# Patient Record
Sex: Female | Born: 1965 | Race: Black or African American | Hispanic: No | Marital: Single | Smoking: Former smoker
Health system: Southern US, Community
[De-identification: ages and names within clinical notes are randomized; demographics above are authoritative.]

## PROBLEM LIST (undated history)

## (undated) DIAGNOSIS — I10 Essential (primary) hypertension: Secondary | ICD-10-CM

## (undated) HISTORY — PX: TONSILLECTOMY: SUR1361

## (undated) HISTORY — PX: TUBAL LIGATION: SHX77

---

## 2010-06-10 ENCOUNTER — Encounter: Payer: Self-pay | Admitting: Unknown Physician Specialty

## 2015-03-11 ENCOUNTER — Encounter (HOSPITAL_BASED_OUTPATIENT_CLINIC_OR_DEPARTMENT_OTHER): Payer: Self-pay | Admitting: Emergency Medicine

## 2015-03-11 ENCOUNTER — Emergency Department (HOSPITAL_BASED_OUTPATIENT_CLINIC_OR_DEPARTMENT_OTHER): Payer: Medicaid Other

## 2015-03-11 ENCOUNTER — Emergency Department (HOSPITAL_BASED_OUTPATIENT_CLINIC_OR_DEPARTMENT_OTHER)
Admission: EM | Admit: 2015-03-11 | Discharge: 2015-03-11 | Disposition: A | Discharge status: Home / Self Care | Payer: Medicaid Other | Attending: Emergency Medicine | Admitting: Emergency Medicine

## 2015-03-11 DIAGNOSIS — R252 Cramp and spasm: Secondary | ICD-10-CM | POA: Diagnosis not present

## 2015-03-11 DIAGNOSIS — R52 Pain, unspecified: Secondary | ICD-10-CM

## 2015-03-11 DIAGNOSIS — M26622 Arthralgia of left temporomandibular joint: Secondary | ICD-10-CM | POA: Diagnosis not present

## 2015-03-11 DIAGNOSIS — R6884 Jaw pain: Secondary | ICD-10-CM | POA: Diagnosis present

## 2015-03-11 MED ORDER — HYDROMORPHONE HCL 1 MG/ML IJ SOLN
1.0000 mg | Freq: Once | INTRAMUSCULAR | Status: AC
  Administered 2015-03-11: 1 mg via INTRAMUSCULAR
  Filled 2015-03-11: qty 1

## 2015-03-11 MED ORDER — ORPHENADRINE CITRATE ER 100 MG PO TB12: 100.0000 mg | ORAL_TABLET | Freq: Two times a day (BID) | ORAL | Status: DC

## 2015-03-11 MED ORDER — HYDROCODONE-ACETAMINOPHEN 5-325 MG PO TABS: 1.0000 | ORAL_TABLET | ORAL | Status: DC | PRN

## 2015-03-11 MED ORDER — NAPROXEN 500 MG PO TABS: 500.0000 mg | ORAL_TABLET | Freq: Two times a day (BID) | ORAL | Status: DC

## 2015-03-11 MED ORDER — KETOROLAC TROMETHAMINE 60 MG/2ML IM SOLN
60.0000 mg | Freq: Once | INTRAMUSCULAR | Status: AC
  Administered 2015-03-11: 60 mg via INTRAMUSCULAR
  Filled 2015-03-11: qty 2

## 2015-03-11 NOTE — ED Provider Notes (Signed)
CSN: [id number]     Arrival date & time 03/11/15  1155 History   First MD Initiated Contact with Patient 03/11/15 1259     Chief Complaint  Patient presents with  . Jaw Pain     (Consider location/radiation/quality/duration/timing/severity/associated sxs/prior Treatment) HPI Patient reports that she gradually started developing pain in her left jaw that became very intense and is not allowing her to open and close her mouth. This happened over the past day. She indicates the pain is directly on her temporomandibular joint. She denies she's had earache or dental pain. She has not had sinus infection or nasal drainage. She denies neck pain or fever. She reports she has been taking an over-the-counter pain medication but it has not been relieving the symptoms. She denies that it started suddenly with any jaw motion, injury or yawn. History reviewed. No pertinent past medical history. History reviewed. No pertinent past surgical history. History reviewed. No pertinent family history. Social History  Substance Use Topics  . Smoking status: Never Smoker   . Smokeless tobacco: None  . Alcohol Use: No   OB History    No data available     Review of Systems  10 Systems reviewed and are negative for acute change except as noted in the HPI.   Allergies  Review of patient's allergies indicates no known allergies.  Home Medications   Prior to Admission medications   Medication Sig Start Date End Date Taking? Authorizing Provider  HYDROcodone-acetaminophen (NORCO/VICODIN) 5-325 MG tablet Take 1-2 tablets by mouth every 4 (four) hours as needed for moderate pain or severe pain. 03/11/15   Arby BarretteMarcy Crespin Forstrom, MD  naproxen (NAPROSYN) 500 MG tablet Take 1 tablet (500 mg total) by mouth 2 (two) times daily. 03/11/15   Arby BarretteMarcy Lam Mccubbins, MD  orphenadrine (NORFLEX) 100 MG tablet Take 1 tablet (100 mg total) by mouth 2 (two) times daily. 03/11/15   Arby BarretteMarcy Larina Lieurance, MD   BP 128/78 mmHg  Pulse 80   Temp(Src) 97.5 F (36.4 C) (Oral)  Resp 18  Ht 5\' 2"  (1.575 m)  Wt 142 lb (64.411 kg)  BMI 25.97 kg/m2  SpO2 100%  LMP 02/22/2015 Physical Exam  Constitutional: She is oriented to person, place, and time. She appears well-developed and well-nourished.  Patient is in pain secondary to her jaw. No respiratory distress. She is appropriate and a good historian.  HENT:  Head: Normocephalic and atraumatic.  Patient has focal pain to palpation over the left temporal mandibular joint. I cannot appreciate asymmetry or swelling. There is no tenderness over the angle of the mandible or the mandible itself. Also no tenderness over the zygoma. Facial swelling. Patient can only open the mouth approximately 1 inch. I have palpated the interior of her mouth and there is no tenderness along the gums. She has dentition that is in excellent condition. Left TM is normal. There is no tragus tenderness there are no rashes around the side of the face. The neck is supple without lymphadenopathy or meningismus.  Eyes: EOM are normal. Pupils are equal, round, and reactive to light.  Neck: Neck supple.  Cardiovascular: Normal rate and normal heart sounds.   Pulmonary/Chest: Effort normal and breath sounds normal.  Musculoskeletal: Normal range of motion. She exhibits no edema or tenderness.  Neurological: She is alert and oriented to person, place, and time. No cranial nerve deficit. Coordination normal.  Skin: Skin is warm and dry.  Psychiatric: She has a normal mood and affect.    ED Course  Procedures (including critical care time) Labs Review Labs Reviewed - No data to display  Imaging Review Dg Tmj Open & Close Unilateral Left  03/11/2015  CLINICAL DATA:  Fell pain, worse on the left. Difficulty opening mouth. EXAM: LEFT UNILATERAL TEMPOROMANDIBULAR JOINTS COMPARISON:  None available. FINDINGS: The mandible is intact. The TMJ appears closed on both views. There is no significant anterior translation from  the temporal fossa on either view. IMPRESSION: 1. No significant translation between open and closed views of the mouth. Minimal opening is evident. 2. The mandible appears to be intact. Electronically Signed   By: Marin Roberts M.D.   On: 03/11/2015 14:40   I have personally reviewed and evaluated these images and lab results as part of my medical decision-making.   EKG Interpretation None      MDM   Final diagnoses:  Pain  Arthralgia of left temporomandibular joint  Trismus   Patient is alert and appropriate. Only pain localized directly to the left temporal mandibular joint. Patient has trismus association with this. There is no injury pattern to suggest of the jaw has been dislocated or fractured. With administration of Toradol and Dilaudid patient did have improvement of mouth opening by approximately three quarters of an inch. At this time I suspect this is most consistent with focal TMJ inflammation with local muscular spasm. There is no evidence of other illness or facial etiology. Patient has dental follow-up on Wednesday. She will be trialed on naproxen, Norflex and Vicodin for pain control. She is counseled to return should she develop objective swelling, worsening pain, fever or other concerning symptoms.    Arby Barrette, MD 03/11/15 [phone]

## 2015-03-11 NOTE — Discharge Instructions (Signed)

## 2015-03-11 NOTE — ED Notes (Signed)
Pt reports acute onset of jaw pain, L>R, denies injury or illness.

## 2015-07-04 ENCOUNTER — Encounter (HOSPITAL_BASED_OUTPATIENT_CLINIC_OR_DEPARTMENT_OTHER): Payer: Self-pay | Admitting: *Deleted

## 2015-07-04 ENCOUNTER — Emergency Department (HOSPITAL_BASED_OUTPATIENT_CLINIC_OR_DEPARTMENT_OTHER)
Admission: EM | Admit: 2015-07-04 | Discharge: 2015-07-04 | Disposition: A | Discharge status: Home / Self Care | Payer: Medicaid Other | Attending: Emergency Medicine | Admitting: Emergency Medicine

## 2015-07-04 DIAGNOSIS — R110 Nausea: Secondary | ICD-10-CM | POA: Diagnosis not present

## 2015-07-04 DIAGNOSIS — J029 Acute pharyngitis, unspecified: Secondary | ICD-10-CM | POA: Diagnosis not present

## 2015-07-04 LAB — RAPID STREP SCREEN (MED CTR MEBANE ONLY): STREPTOCOCCUS, GROUP A SCREEN (DIRECT): NEGATIVE

## 2015-07-04 MED ORDER — ONDANSETRON 4 MG PO TBDP: 4.0000 mg | ORAL_TABLET | Freq: Three times a day (TID) | ORAL | Status: DC | PRN

## 2015-07-04 MED ORDER — AMOXICILLIN-POT CLAVULANATE 875-125 MG PO TABS
1.0000 | ORAL_TABLET | Freq: Once | ORAL | Status: AC
  Administered 2015-07-04: 1 via ORAL
  Filled 2015-07-04: qty 1

## 2015-07-04 MED ORDER — DEXAMETHASONE SODIUM PHOSPHATE 10 MG/ML IJ SOLN
10.0000 mg | Freq: Once | INTRAMUSCULAR | Status: AC
  Administered 2015-07-04: 10 mg via INTRAMUSCULAR
  Filled 2015-07-04: qty 1

## 2015-07-04 MED ORDER — IBUPROFEN 800 MG PO TABS: 800.0000 mg | ORAL_TABLET | Freq: Three times a day (TID) | ORAL | Status: DC

## 2015-07-04 MED ORDER — KETOROLAC TROMETHAMINE 60 MG/2ML IM SOLN
60.0000 mg | Freq: Once | INTRAMUSCULAR | Status: AC
  Administered 2015-07-04: 60 mg via INTRAMUSCULAR
  Filled 2015-07-04: qty 2

## 2015-07-04 MED ORDER — AMOXICILLIN-POT CLAVULANATE 875-125 MG PO TABS: 1.0000 | ORAL_TABLET | Freq: Two times a day (BID) | ORAL | Status: DC

## 2015-07-04 MED FILL — IBUPROFEN 800 MG TABLET: 800 | 7 days supply | Qty: 21 | Fill #0

## 2015-07-04 MED FILL — ONDANSETRON ODT 4 MG TABLET: 4 | 6 days supply | Qty: 20 | Fill #0

## 2015-07-04 MED FILL — AMOX-CLAV 875-125 MG TABLET: 875-125 | 7 days supply | Qty: 14 | Fill #0

## 2015-07-04 NOTE — ED Notes (Signed)
C/o Sorethroat x 2 weeks with pain on chin and neck area as well. No fever.

## 2015-07-04 NOTE — Discharge Instructions (Signed)
You have been seen today for a sore throat. Follow up with PCP as needed. Return to ED should symptoms worsen. Please take all of your antibiotics until finished!   You may develop abdominal discomfort or diarrhea from the antibiotic.  You may help offset this with probiotics which you can buy or get in yogurt. Do not eat or take the probiotics until 2 hours after your antibiotic. Get plenty of rest and drink plenty of fluids. Ibuprofen for pain or fever. Warm liquids or Chloraseptic spray may help soothe the sore throat.   Emergency Department Resource Guide 1) Find a Doctor and Pay Out of Pocket Although you won't have to find out who is covered by your insurance plan, it is a good idea to ask around and get recommendations. You will then need to call the office and see if the doctor you have chosen will accept you as a new patient and what types of options they offer for patients who are self-pay. Some doctors offer discounts or will set up payment plans for their patients who do not have insurance, but you will need to ask so you aren't surprised when you get to your appointment.  2) Contact Your Local Health Department Not all health departments have doctors that can see patients for sick visits, but many do, so it is worth a call to see if yours does. If you don't know where your local health department is, you can check in your phone book. The CDC also has a tool to help you locate your state's health department, and many state websites also have listings of all of their local health departments.  3) Find a Walk-in Clinic If your illness is not likely to be very severe or complicated, you may want to try a walk in clinic. These are popping up all over the country in pharmacies, drugstores, and shopping centers. They're usually staffed by nurse practitioners or physician assistants that have been trained to treat common illnesses and complaints. They're usually fairly quick and inexpensive. However,  if you have serious medical issues or chronic medical problems, these are probably not your best option.  No Primary Care Doctor: - Call Health Connect at  [phone] - they can help you locate a primary care doctor that  accepts your insurance, provides certain services, etc. - Physician Referral Service- [phone]  Chronic Pain Problems: Organization         Address  Phone   Notes  [facility]  [phone] Patients need to be referred by their primary care doctor.   Medication Assistance: Organization         Address  Phone   Notes  [facility] Medication [facility] [address] [place] [place], [place] [address] [phone] --Must be a resident of [facility] -- Must have NO insurance coverage whatsoever (no Medicaid/ Medicare, etc.) -- The pt. MUST have a primary care doctor that directs their care regularly and follows them in the community   [facility]ist  [phone]   [employer]  [phone]    Agencies that provide inexpensive medical care: Organization         Address  Phone   Notes  [facility]  [phone]   [facility]    [phone]   [facility] [address] [place], [place] [address] [phone]   [facility] [address] [place], [place] [phone]  Planned Parenthood    [phone]   [facility]    [phone]   [facility] and [place] [address], [place] Phone:  [phone], Fax:  [phone] Hours of Operation:  9 am - 6 pm, M-F.  Also accepts Medicaid/Medicare and self-pay.  [facility] for [address], [address], [place] Phone: [phone], Fax: [phone]. Hours of Operation:  8:30 am - 5:30 pm, M-F.  Also accepts Medicaid and self-pay.  [facility] [place] [address] [place] Phone: [phone]   [place], [place], [place] [phone], Ext. [phone] Mondays & Thursdays: 7-9 AM.  First 15 patients are seen on a first come, first serve basis.    [place] Providers:  Organization         Address  Phone   Notes  [facility] [address], [place] [phone] Also accepts self-pay patients.  [facility] [address], [place]  [phone]   [place], [address], [place] [phone]   [facility] [address], [place] [phone]   Lucianne Lei [address], [place]   [phone] Only accepts [place] Access [place] patients after they have their name applied to their card.   Self-Pay (no insurance) in [facility]:  Organization         Address  Phone   Notes  Sickle Cell Patients, [facility] [place] [phone]   [facility] [place] [phone]   [facility] [place]  [place], [place], [place] [phone]   [facility]/Dr. Osei-Bonsu  [address], [place] or [address], [place] [phone] Phone number for both [place] and [place] locations is the same.  [facility] [address], [place] [phone]   [facility] [address] [place] or [address] [phone]   [facility] [address] [place] [phone], phone; [phone], fax Sees patients 1st and 3rd Saturday of every month.  Must not qualify for public or private insurance (i.e. Medicaid, Medicare, [place] Health Choice, Veterans' Benefits)  Household income should be no more than 200% of the poverty level The clinic cannot treat you if you are pregnant or think you are  pregnant  Sexually transmitted diseases are not treated at the clinic.    Dental Care: Organization         Address  Phone  Notes  [facility] Department of [facility] [place] [phone] Accepts children up to age 33 who are enrolled in [place] or [place]; pregnant women with a Medicaid card; and children who have applied for Medicaid or [place] Health Choice, but were declined, whose parents can pay a reduced fee at time of service.  [facility] of [facility]  [address], [place] [phone] Accepts children up to age 110 who are enrolled in [place] or [place]; pregnant women with a Medicaid card; and children who have applied for Medicaid or [place], but were declined,  whose parents can pay a reduced fee at time of service.  [place] Adult [facility] Access PROGRAM  [place] [phone] Patients are seen by appointment only. Walk-ins are not accepted. [place] will see patients 59 years of age and older. Monday - Tuesday (8am-5pm) Most Wednesdays (8:30-5pm) $30 per visit, cash only  [facility] Adult [facility] Access PROGRAM  [address], [facility] [phone] Patients are seen by appointment only. Walk-ins are not accepted. [place] will see patients 72 years of age and older. One Wednesday Evening (Monthly: Volunteer Based).  $30 per visit, cash only  [place]  [phone] for adults; Children under age 53, call Graduate Pediatric Dentistry at [phone]. Children aged 3-14, please call [phone] to request a pediatric application.  Dental services are provided in all areas of dental care including fillings, crowns and bridges, complete and partial dentures, implants, gum treatment, root canals, and extractions. Preventive care is also provided. Treatment is provided to both adults and  children. Patients are selected via a lottery and there is often a waiting list.   [facility] [address], [place]  [phone] [email or web address]   [facility] [address] [place], [place] [phone], Ext. [phone] Second and Fourth Thursday of each month, opens at 6:30 AM; Clinic ends at 9 AM.  Patients are seen on a first-come first-served basis, and a limited number are seen during each clinic.   [facility]  [address] Hillard Danker [place], [place] [phone]   Eligibility Requirements You must have lived in [place], [place], or [place] counties for at least the last three months.   You cannot be eligible for state or federal sponsored [employer], including [employer], [place], or [employer].   You generally cannot be eligible for healthcare insurance through your employer.    How to apply: Eligibility screenings are held every Tuesday and Wednesday afternoon from 1:00 pm until 4:00 pm. You do not need an appointment for the interview!  [facility] [address], [place], [place]   [place]  [place] Department  [place]  [phone]    Behavioral Health Resources in the Community: Intensive Outpatient Programs Organization         Address  Phone  Notes  [place]. [address], [place], [place] [phone]   [facility] Outpatient [address] [place], [place]   ADS: Alcohol [facility] Drug S[facility] [address] [place], [place]   [place] [address]  [place], [place] or [phone]   Substance Abuse Resources Organization         Address  Phone  Notes  Alcohol and Drug Services  [phone]   [place]  [phone]   [facility]    Chinita Pester  [phone]   Residential & Outpatient Substance Abuse Program  [phone]   Psychological Services Organization         Address  Phone  Notes  [facility] [place]  [place]  [phone]   [place] [address] [place] or [phone]    Mobile Crisis Teams Organization         Address  Phone  Notes  [facility] Alternatives, Mobile  Crisis Care Unit  [phone]   [facility]ive Psychotherapeutic Services  [address] [place], [place]   [facility] [address], [place] [phone]    Self-Help/Support Groups Organization         Address  Phone             Notes  [place]. of [place] - variety of support groups  [place] Call for more information  Narcotics Anonymous (NA), [facility] [address], [employer] [place]  2 meetings at this location   Special educational needs teacher         Address  Phone  Notes  [facility]AP Residential Treatment [place],    [place]  [phone]   [facility]  [address], [place] [phone], [place], [place]   [place], [place] [phone] Admissions: 8am-3pm M-F  Incentives Substance [place] [address]    [place], [place] [phone]   [facility] [address] [place], [place], [place]   [facility] [address],  [place], [place]   [facility] - Intensive Outpatient [address], [place], [place]   [facility] ([place].,  [place], [place] [phone] or [phone]   [facility] ([facility]) [address], [place], [place] Accepts Medicaid  [facility] [place] [address]  [place] [phone] Substance Abuse/Addiction Treatment   [facility] Organization         Address  Phone  Notes  [facility]  [phone]   Domenic Schwab, PhD [address] Arlis Porta [place], [place]   [phone] or [phone]   [place], [place] [phone]   [facility] [address] [place], [place] [phone] Insurance/Medicaid/sponsorship through [facility] and Families [address], [address] [place]                                    [place], [place] [phone] [place] [address], [place] [phone]    Dr. Adele Schilder  [phone]   [facility] Dept. 1) [address], [place] 2) [place] 3)  [place] [address], [place] [phone]  [phone]   [place] [phone] or [phone] (After Hours)

## 2015-07-04 NOTE — ED Provider Notes (Signed)
CSN: [id number]     Arrival date & time 07/04/15  8657 History   First MD Initiated Contact with Patient 07/04/15 1004     No chief complaint on file.    (Consider location/radiation/quality/duration/timing/severity/associated sxs/prior Treatment) HPI   Dajanae Brophy is a 50 y.o. female, patient with no pertinent past medical history, presenting to the ED with sore throat for the last two weeks. Pt states the pain is localized to the right side of her throat. Pt rates the pain at 10/10, throbbing, non-radiating. Pt also complains of some nausea. Pt has tried tylenol for pain with minimal relief. Pt denies fever/chills, vomiting, difficulty swallowing or breathing, or any other complaints.   History reviewed. No pertinent past medical history. History reviewed. No pertinent past surgical history. No family history on file. Social History  Substance Use Topics  . Smoking status: Never Smoker   . Smokeless tobacco: None  . Alcohol Use: No   OB History    No data available     Review of Systems  Constitutional: Negative for fever and chills.  HENT: Positive for sore throat. Negative for trouble swallowing and voice change.   Gastrointestinal: Positive for nausea. Negative for vomiting.  Neurological: Negative for headaches.      Allergies  Review of patient's allergies indicates no known allergies.  Home Medications   Prior to Admission medications   Medication Sig Start Date End Date Taking? Authorizing Provider  amoxicillin-clavulanate (AUGMENTIN) 875-125 MG tablet Take 1 tablet by mouth every 12 (twelve) hours. 07/04/15   Shawn C Joy, PA-C  ibuprofen (ADVIL,MOTRIN) 800 MG tablet Take 1 tablet (800 mg total) by mouth 3 (three) times daily. 07/04/15   Shawn C Joy, PA-C  ondansetron (ZOFRAN ODT) 4 MG disintegrating tablet Take 1 tablet (4 mg total) by mouth every 8 (eight) hours as needed for nausea or vomiting. 07/04/15   Shawn C Joy, PA-C   BP 125/98 mmHg  Pulse 85  Temp(Src)  97.9 F (36.6 C) (Oral)  Resp 16  Ht  (1.6 m)  Wt 60.328 kg  BMI 23.57 kg/m2  SpO2 99%  LMP 06/20/2015 Physical Exam  Constitutional: She appears well-developed and well-nourished. No distress.  HENT:  Head: Normocephalic and atraumatic.  Right Ear: External ear and ear canal normal.  Left Ear: Tympanic membrane, external ear and ear canal normal.  Mouth/Throat: Uvula is midline and mucous membranes are normal. No oral lesions. No trismus in the jaw. No uvula swelling. Posterior oropharyngeal edema and posterior oropharyngeal erythema present. No tonsillar abscesses.  Eyes: Conjunctivae are normal.  Neck: Normal range of motion. Neck supple.  No neck edema or masses.  Cardiovascular: Normal rate and regular rhythm.   Pulmonary/Chest: Effort normal.  Lymphadenopathy:    She has no cervical adenopathy.  Neurological: She is alert.  Skin: Skin is warm and dry. She is not diaphoretic.  Nursing note and vitals reviewed.   ED Course  Procedures (including critical care time) Labs Review Labs Reviewed  RAPID STREP SCREEN (NOT AT [facility])  CULTURE, GROUP A STREP [facility])    Imaging Review No results found. I have personally reviewed and evaluated these lab results as part of my medical decision-making.   EKG Interpretation None      MDM   Final diagnoses:  Pharyngitis    Kendyll Powell presents with a sore throat for the last 2 weeks.  Patient is afebrile, not tachycardic, not tachypneic, maintains SPO2 of 99% on room air, is nontoxic appearing, and  is in no apparent distress. Antibiotic prescribed due to the duration of symptoms and the unilateral nature of the pain. No masses or visual signs of peritonsillar or retro-pharyngeal abscess were noted. Toradol given for pain. Decadron given for sensation of swelling. No indication for imaging at this time. Patient was given home care instructions and strict return precautions. Patient voiced understanding of these instructions,  accepted the plan, and is comfortable with discharge.  Anselm Pancoast, PA-C 07/04/15 1050  Derwood Kaplan, MD 07/04/15 1622

## 2015-07-06 LAB — CULTURE, GROUP A STREP (THRC)

## 2016-06-11 ENCOUNTER — Encounter (HOSPITAL_BASED_OUTPATIENT_CLINIC_OR_DEPARTMENT_OTHER): Payer: Self-pay

## 2016-06-11 ENCOUNTER — Emergency Department (HOSPITAL_BASED_OUTPATIENT_CLINIC_OR_DEPARTMENT_OTHER)
Admission: EM | Admit: 2016-06-11 | Discharge: 2016-06-11 | Disposition: A | Discharge status: Home / Self Care | Payer: Medicaid Other | Attending: Dermatology | Admitting: Dermatology

## 2016-06-11 DIAGNOSIS — R05 Cough: Secondary | ICD-10-CM | POA: Insufficient documentation

## 2016-06-11 DIAGNOSIS — Z5321 Procedure and treatment not carried out due to patient leaving prior to being seen by health care provider: Secondary | ICD-10-CM | POA: Diagnosis not present

## 2016-06-11 NOTE — ED Notes (Signed)
Pt seen leaving ed by registration staff. Did not notify staff that she was leaving.

## 2016-06-11 NOTE — ED Triage Notes (Signed)
C/o cough, chills,SOB x 5 days-NAD-steady gait

## 2016-06-26 ENCOUNTER — Emergency Department (HOSPITAL_BASED_OUTPATIENT_CLINIC_OR_DEPARTMENT_OTHER)
Admission: EM | Admit: 2016-06-26 | Discharge: 2016-06-26 | Disposition: A | Discharge status: Home / Self Care | Payer: Medicaid Other | Source: Home / Self Care

## 2016-06-26 ENCOUNTER — Encounter (HOSPITAL_BASED_OUTPATIENT_CLINIC_OR_DEPARTMENT_OTHER): Payer: Self-pay | Admitting: *Deleted

## 2016-06-26 ENCOUNTER — Emergency Department (HOSPITAL_BASED_OUTPATIENT_CLINIC_OR_DEPARTMENT_OTHER)
Admission: EM | Admit: 2016-06-26 | Discharge: 2016-06-26 | Disposition: A | Discharge status: Home / Self Care | Payer: Medicaid Other | Attending: Emergency Medicine | Admitting: Emergency Medicine

## 2016-06-26 DIAGNOSIS — M542 Cervicalgia: Secondary | ICD-10-CM | POA: Insufficient documentation

## 2016-06-26 DIAGNOSIS — Z5321 Procedure and treatment not carried out due to patient leaving prior to being seen by health care provider: Secondary | ICD-10-CM

## 2016-06-26 DIAGNOSIS — R110 Nausea: Secondary | ICD-10-CM | POA: Insufficient documentation

## 2016-06-26 DIAGNOSIS — R51 Headache: Secondary | ICD-10-CM | POA: Diagnosis not present

## 2016-06-26 DIAGNOSIS — R599 Enlarged lymph nodes, unspecified: Secondary | ICD-10-CM | POA: Diagnosis not present

## 2016-06-26 DIAGNOSIS — J029 Acute pharyngitis, unspecified: Secondary | ICD-10-CM | POA: Insufficient documentation

## 2016-06-26 LAB — RAPID STREP SCREEN (MED CTR MEBANE ONLY): Streptococcus, Group A Screen (Direct): NEGATIVE

## 2016-06-26 MED ORDER — IBUPROFEN 400 MG PO TABS
400.0000 mg | ORAL_TABLET | Freq: Once | ORAL | Status: AC | PRN
  Administered 2016-06-26: 400 mg via ORAL
  Filled 2016-06-26 (×2): qty 1

## 2016-06-26 MED ORDER — CLINDAMYCIN HCL 150 MG PO CAPS: 300.0000 mg | ORAL_CAPSULE | Freq: Four times a day (QID) | ORAL | 0 refills | Status: DC

## 2016-06-26 NOTE — ED Provider Notes (Signed)
[facility] Provider Note   CSN: [id number] Arrival date & time: 06/26/16  1448  By signing my name below, I, Rosario Adie, attest that this documentation has been prepared under the direction and in the presence of Renne Crigler, PA-C.  Electronically Signed: Rosario Adie, ED Scribe. 06/26/16. 6:00 PM.  History   Chief Complaint Chief Complaint  Patient presents with  . Sore Throat   The history is provided by the patient. No language interpreter was used.    HPI Comments: Kimberly [name] is a 51 y.o. female with no pertinent PMHx, who presents to the Emergency Department complaining of constant bilateral anterior neck pain (L>R) beginning one week ago. She notes associated intermittent headaches and nausea secondary to the onset of her neck pain. Pt additionally notes that she has also experienced episodes of dysphasia at night after periods of laying flat, but this will resolved shortly after sitting forwards. She was seen in the ED for this issue this morning, but she eloped at that time. Pt was screened for strep throat which was resulted as negative. Pt reports that the her neck pain on the left side is exacerbated with palpation over the area. No noted treatments for her pain were tried prior to coming into the ED. She is not currently followed by a PCP. Pt denies fever, vomiting, or any other associated symptoms.   History reviewed. No pertinent past medical history.  There are no active problems to display for this patient.  Past Surgical History:  Procedure Laterality Date  . TONSILLECTOMY     OB History    No data available     Home Medications    Prior to Admission medications   Not on File   Family History No family history on file.  Social History Social History  Substance Use Topics  . Smoking status: Never Smoker  . Smokeless tobacco: Never Used  . Alcohol use No   Allergies   Patient has no known allergies.  Review of  Systems Review of Systems  Constitutional: Negative for chills, fatigue and fever.  HENT: Positive for trouble swallowing (pain only). Negative for congestion, ear pain, rhinorrhea, sinus pressure and sore throat.   Eyes: Negative for redness.  Respiratory: Negative for cough and wheezing.   Gastrointestinal: Positive for nausea. Negative for abdominal pain, diarrhea and vomiting.  Genitourinary: Negative for dysuria.  Musculoskeletal: Positive for neck pain (anterior, bilateral). Negative for myalgias and neck stiffness.  Skin: Negative for rash.  Neurological: Positive for headaches.  Hematological: Negative for adenopathy.   Physical Exam Updated Vital Signs BP 137/96 (BP Location: Right Arm)   Pulse 94   Temp 98.2 F (36.8 C) (Oral)   Resp 16   Ht 5\' 2"  (1.575 m)   Wt 140 lb (63.5 kg)   LMP 06/04/2016   SpO2 99%   BMI 25.61 kg/m   Physical Exam  Constitutional: She appears well-developed and well-nourished. No distress.  HENT:  Head: Normocephalic and atraumatic.  Right Ear: Tympanic membrane, external ear and ear canal normal.  Left Ear: Tympanic membrane, external ear and ear canal normal.  Nose: Nose normal. No mucosal edema or rhinorrhea.  Mouth/Throat: Oropharynx is clear and moist and mucous membranes are normal. No oral lesions. No trismus in the jaw. No uvula swelling. No oropharyngeal exudate, posterior oropharyngeal edema, posterior oropharyngeal erythema or tonsillar abscesses. No tonsillar exudate.  No trismus. Full range of motion of neck without difficulty.   Eyes: Conjunctivae are normal.  Neck: Normal range of motion.  Cardiovascular: Normal rate.   Pulmonary/Chest: Effort normal.  Abdominal: She exhibits no distension.  Musculoskeletal: Normal range of motion.  Lymphadenopathy:       Head (right side): No submental, no submandibular, no tonsillar, no preauricular, no posterior auricular and no occipital adenopathy present.       Head (left side):  Submandibular (Tender) adenopathy present. No submental, no tonsillar, no preauricular, no posterior auricular and no occipital adenopathy present.    She has no cervical adenopathy.  Neurological: She is alert.  Skin: No pallor.  Psychiatric: She has a normal mood and affect. Her behavior is normal.  Nursing note and vitals reviewed.  ED Treatments / Results  DIAGNOSTIC STUDIES: Oxygen Saturation is 99% on RA, normal by my interpretation.   COORDINATION OF CARE: 6:00 PM-Discussed next steps with pt including consult with attending provider, Dr Rush Landmarkegeler. He has seen patient. Agrees with trial of clindamycin, f/u with ENT if not improving after that. Pt verbalized understanding and is agreeable with the plan.    Procedures Procedures   Medications Ordered in ED Medications - No data to display  Initial Impression / Assessment and Plan / ED Course  I have reviewed the triage vital signs and the nursing notes.  Pertinent labs & imaging results that were available during my care of the patient were reviewed by me and considered in my medical decision making (see chart for details).     Vital signs reviewed and are as follows: Vitals:   06/26/16 1451  BP: 137/96  Pulse: 94  Resp: 16  Temp: 98.2 F (36.8 C)     Final Clinical Impressions(s) / ED Diagnoses   Final diagnoses:  Swollen lymph nodes   Patient with Tender nodule likely submandibular lymph node on the left. No trismus or difficulty with range of motion of neck. Do not suspect peritonsillar abscess, retropharyngeal abscess or other deep space infection. This is likely a reactive lymph node. Less likely swollen salivary glands. No respiratory difficulty. Patient is swallowing well. No indication for advanced imaging at that time. Patient given ENT f/u.   New Prescriptions New Prescriptions   CLINDAMYCIN (CLEOCIN) 150 MG CAPSULE    Take 2 capsules (300 mg total) by mouth every 6 (six) hours.   I personally  performed the services described in this documentation, which was scribed in my presence. The recorded information has been reviewed and is accurate.     Renne CriglerJoshua Elbert Polyakov, PA-C 06/26/16 1834    Canary Brimhristopher J Tegeler, MD 06/27/16 [phone]

## 2016-06-26 NOTE — Discharge Instructions (Signed)
Please read and follow all provided instructions.  Your diagnoses today include:  1. Swollen lymph nodes     Tests performed today include:  Vital signs. See below for your results today.   Strep test - negative   Medications prescribed:   Clindamycin - antibiotic  You have been prescribed an antibiotic medicine: take the entire course of medicine even if you are feeling better. Stopping early can cause the antibiotic not to work.  Home care instructions:  Follow any educational materials contained in this packet.  Follow-up instructions: Follow-up with ear nose and throat doctor listed in 2 weeks if not improved.  Return instructions:   Please return to the Emergency Department if you experience worsening symptoms.   Return with worsening pain in her neck, difficulty swallowing, fevers, difficult to breathing.   Please return if you have any other emergent concerns.  Additional Information:  Your vital signs today were: BP 137/96 (BP Location: Right Arm)    Pulse 94    Temp 98.2 F (36.8 C) (Oral)    Resp 16    Ht 5\' 2"  (1.575 m)    Wt 63.5 kg    LMP 06/04/2016    SpO2 99%    BMI 25.61 kg/m  If your blood pressure (BP) was elevated above 135/85 this visit, please have this repeated by your doctor within one month. ---------------

## 2016-06-26 NOTE — ED Triage Notes (Signed)
States she came here 2 weeks ago for cold symptoms but left before being seen due to long wait. Here today for sore throat. No fever. She feels sinus drainage, headache.

## 2016-06-26 NOTE — ED Triage Notes (Signed)
She was here this am and left. Returns with sore throat no distress.

## 2016-06-28 LAB — CULTURE, GROUP A STREP (THRC)

## 2017-02-02 ENCOUNTER — Encounter (HOSPITAL_BASED_OUTPATIENT_CLINIC_OR_DEPARTMENT_OTHER): Payer: Self-pay

## 2017-02-02 ENCOUNTER — Emergency Department (HOSPITAL_BASED_OUTPATIENT_CLINIC_OR_DEPARTMENT_OTHER)
Admission: EM | Admit: 2017-02-02 | Discharge: 2017-02-02 | Disposition: A | Discharge status: Home / Self Care | Payer: Medicaid Other | Attending: Emergency Medicine | Admitting: Emergency Medicine

## 2017-02-02 DIAGNOSIS — Z87891 Personal history of nicotine dependence: Secondary | ICD-10-CM | POA: Insufficient documentation

## 2017-02-02 DIAGNOSIS — R590 Localized enlarged lymph nodes: Secondary | ICD-10-CM | POA: Diagnosis not present

## 2017-02-02 DIAGNOSIS — J029 Acute pharyngitis, unspecified: Secondary | ICD-10-CM | POA: Diagnosis present

## 2017-02-02 NOTE — ED Provider Notes (Signed)
[facility]   CSN: [id number] Arrival date & time: 02/02/17  1537     History   Chief Complaint Chief Complaint  Patient presents with  . Sore Throat    HPI Kimberly [name] is a 51 y.o. female.  HPI   Ms. Melick is a 51yo female with no significant past medical history who presents to the Emergency Department for evaluation of tenderness in her left neck. Patient states that she has had tenderness over her left neck for several weeks now. Pain is an aching 8/10 and is worsened with palpation over the neck. She is able to move neck in all directions without difficulty, no voice change, no difficulty swallowing, no sore throat or fever. Patient states that she has had pain over the lymph node a few months ago and was referred to ENT who did an ultrasound which was negative. The pain went away for a period of time but is now back. She states that this pain is similar to her previous episode and in the same place. She denies previous cancer history. She has tried ibuprofen for the pain with some relief. She did have a cough several weeks ago when the pain in her neck started.   History reviewed. No pertinent past medical history.  There are no active problems to display for this patient.   Past Surgical History:  Procedure Laterality Date  . TONSILLECTOMY      OB History    No data available       Home Medications    Prior to Admission medications   Not on File    Family History No family history on file.  Social History Social History  Substance Use Topics  . Smoking status: Former Games developer  . Smokeless tobacco: Never Used  . Alcohol use Yes     Comment: occ     Allergies   Patient has no known allergies.   Review of Systems Review of Systems  Constitutional: Negative for chills, fatigue, fever and unexpected weight change.  HENT: Negative for congestion, dental problem, ear pain, mouth sores, rhinorrhea, sore throat, trouble swallowing  and voice change.   Eyes: Negative for pain and visual disturbance.  Respiratory: Positive for cough (weeks ago).   Musculoskeletal: Positive for neck pain. Negative for neck stiffness.  Skin: Negative for rash and wound.     Physical Exam Updated Vital Signs BP (!) 141/88 (BP Location: Left Arm)   Pulse (!) 101   Temp 99.5 F (37.5 C) (Oral)   Resp 18   Ht  (1.6 m)   Wt 64.9 kg (143 lb 1.3 oz)   LMP 01/23/2017   SpO2 100%   BMI 25.35 kg/m   Physical Exam  Constitutional: She is oriented to person, place, and time. She appears well-developed and well-nourished. No distress.  HENT:  Head: Normocephalic and atraumatic.  Right Ear: Tympanic membrane and external ear normal.  Left Ear: Tympanic membrane and external ear normal.  Nose: No rhinorrhea. Right sinus exhibits no maxillary sinus tenderness and no frontal sinus tenderness. Left sinus exhibits no maxillary sinus tenderness and no frontal sinus tenderness.  Mouth/Throat: Oropharynx is clear and moist. No oropharyngeal exudate.  No trismus  Eyes: Pupils are equal, round, and reactive to light. EOM are normal. Right eye exhibits no discharge. Left eye exhibits no discharge.  Neck: Normal range of motion. Neck supple.  Tenderness to palpation over the left submandibular lymph node. It is slightly enlarged compared to  the right side. Lymph node is mobile, soft. No overlying rash, warmth.   Cardiovascular: Normal rate and regular rhythm.  Exam reveals no gallop and no friction rub.   Murmur heard. Holosystolic murmur grade 3/6  Pulmonary/Chest: Effort normal and breath sounds normal. No respiratory distress. She has no wheezes. She has no rales.  Abdominal: Soft. Bowel sounds are normal.  Neurological: She is alert and oriented to person, place, and time. Coordination normal.  Skin: Skin is warm and dry. She is not diaphoretic.  Psychiatric: She has a normal mood and affect.     ED Treatments / Results  Labs (all  labs ordered are listed, but only abnormal results are displayed) Labs Reviewed - No data to display  EKG  EKG Interpretation None       Radiology No results found.  Procedures Procedures (including critical care time)  Medications Ordered in ED Medications - No data to display   Initial Impression / Assessment and Plan / ED Course  I have reviewed the triage vital signs and the nursing notes.  Pertinent labs & imaging results that were available during my care of the patient were reviewed by me and considered in my medical decision making (see chart for details).     Patient with tender submandibular nodule on the left. No difficulty with range of motion of neck, speech difficulty or trismus. Do not suspect peritonsillar abscess, retropharyngeal abscess or other deep space infection. This is likely a reactive lymph node given patient recently had an upper respiratory infection. She is swallowing well. No trouble breathing. No indication for advanced imaging at that time. She has been given information to follow up with ENT. Counseled her of strict return precautions. Patient agrees and voices understanding.  Final Clinical Impressions(s) / ED Diagnoses   Final diagnoses:  Cervical adenopathy    New Prescriptions There are no discharge medications for this patient.    Kellie Shropshire, PA-C 02/02/17 1857    Lavera Guise, MD 02/03/17 Rich Fuchs

## 2017-02-02 NOTE — Discharge Instructions (Signed)
Please schedule an appointment with ENT doctor (Dr. Jearld Fenton) for follow up of painful lymph node.   Continue taking Ibuprofen or tylenol for pain.  Please return to the ER if you have fever, trouble swallowing or moving your neck.

## 2017-02-02 NOTE — ED Triage Notes (Signed)
C/o sore throat x "couple weeks"-NAD-steady gait

## 2017-05-16 ENCOUNTER — Emergency Department (HOSPITAL_BASED_OUTPATIENT_CLINIC_OR_DEPARTMENT_OTHER)
Admission: EM | Admit: 2017-05-16 | Discharge: 2017-05-16 | Disposition: A | Discharge status: Home / Self Care | Payer: Medicaid Other | Attending: Emergency Medicine | Admitting: Emergency Medicine

## 2017-05-16 ENCOUNTER — Encounter (HOSPITAL_BASED_OUTPATIENT_CLINIC_OR_DEPARTMENT_OTHER): Payer: Self-pay | Admitting: *Deleted

## 2017-05-16 ENCOUNTER — Other Ambulatory Visit: Payer: Self-pay

## 2017-05-16 DIAGNOSIS — S80862A Insect bite (nonvenomous), left lower leg, initial encounter: Secondary | ICD-10-CM | POA: Insufficient documentation

## 2017-05-16 DIAGNOSIS — W57XXXA Bitten or stung by nonvenomous insect and other nonvenomous arthropods, initial encounter: Secondary | ICD-10-CM | POA: Insufficient documentation

## 2017-05-16 DIAGNOSIS — S80861A Insect bite (nonvenomous), right lower leg, initial encounter: Secondary | ICD-10-CM | POA: Diagnosis not present

## 2017-05-16 DIAGNOSIS — S1096XA Insect bite of unspecified part of neck, initial encounter: Secondary | ICD-10-CM | POA: Diagnosis present

## 2017-05-16 DIAGNOSIS — Y939 Activity, unspecified: Secondary | ICD-10-CM | POA: Insufficient documentation

## 2017-05-16 DIAGNOSIS — Y929 Unspecified place or not applicable: Secondary | ICD-10-CM | POA: Insufficient documentation

## 2017-05-16 DIAGNOSIS — Y999 Unspecified external cause status: Secondary | ICD-10-CM | POA: Diagnosis not present

## 2017-05-16 DIAGNOSIS — R21 Rash and other nonspecific skin eruption: Secondary | ICD-10-CM | POA: Diagnosis not present

## 2017-05-16 MED ORDER — DIPHENHYDRAMINE HCL 25 MG PO CAPS
25.0000 mg | ORAL_CAPSULE | Freq: Once | ORAL | Status: AC
  Administered 2017-05-16: 25 mg via ORAL
  Filled 2017-05-16: qty 1

## 2017-05-16 NOTE — ED Provider Notes (Signed)
[facility] EMERGENCY DEPARTMENT Provider Note   CSN: [id number] Arrival date & time: 05/16/17  1831     History   Chief Complaint Chief Complaint  Patient presents with  . Insect Bite    HPI Kimberly [name] is a 51 y.o. female.  Kimberly [name] is a 51 y.o. Female who is otherwise healthy, presents complaining of several bug bites on her neck, and a few scattered on her lower extremities.  Patient says she is unsure what specific bug has bitten her.  She reports she noticed a few couple weeks ago on her neck, and went to her primary doctor, who is unsure what specifically they were, but gave her an anti-itch cream which she is unable to remember the name of and this helped and the lesions completely resolved.  Reports new lesion showed up yesterday and today, no one else in her household is experiencing similar symptoms.  No lesions on the hands, upper extremities, trunk or waistline.  No fevers or chills.  She denies any drainage or blisterlike lesions.       History reviewed. No pertinent past medical history.  There are no active problems to display for this patient.   Past Surgical History:  Procedure Laterality Date  . TONSILLECTOMY      OB History    No data available       Home Medications    Prior to Admission medications   Not on File    Family History No family history on file.  Social History Social History   Tobacco Use  . Smoking status: Former Games developermoker  . Smokeless tobacco: Never Used  Substance Use Topics  . Alcohol use: Yes    Comment: occ  . Drug use: No     Allergies   Patient has no known allergies.   Review of Systems Review of Systems  Constitutional: Negative for chills and fever.  HENT: Negative for facial swelling.   Respiratory: Negative for cough, chest tightness, shortness of breath, wheezing and stridor.   Musculoskeletal: Negative for arthralgias and myalgias.  Skin: Positive for rash.     Physical  Exam Updated Vital Signs BP (!) 140/95 (BP Location: Right Arm)   Pulse 90   Temp 99.3 F (37.4 C) (Oral)   Resp 20   Ht 5\' 3"  (1.6 m)   Wt 65.8 kg (145 lb)   LMP 04/16/2017   SpO2 100%   BMI 25.69 kg/m   Physical Exam  Constitutional: She appears well-developed and well-nourished. No distress.  HENT:  Head: Normocephalic and atraumatic.  Eyes: Right eye exhibits no discharge. Left eye exhibits no discharge.  Neck: Normal range of motion. Neck supple.  Pulmonary/Chest: Effort normal. No respiratory distress.  Lymphadenopathy:    She has no cervical adenopathy.  Neurological: She is alert. Coordination normal.  Skin: Skin is warm and dry. Capillary refill takes less than 2 seconds. She is not diaphoretic.  Few scattered raised lesions over the left side of the neck, and bilateral lower extremities, no petechiae or purpura, no vesicular lesions, no pustules or drainage evident, no surrounding erythema to suggest cellulitis or superimposed infection  Psychiatric: She has a normal mood and affect. Her behavior is normal.  Nursing note and vitals reviewed.    ED Treatments / Results  Labs (all labs ordered are listed, but only abnormal results are displayed) Labs Reviewed - No data to display  EKG  EKG Interpretation None       Radiology No results  found.  Procedures Procedures (including critical care time)  Medications Ordered in ED Medications  diphenhydrAMINE (BENADRYL) capsule 25 mg (25 mg Oral Given 05/16/17 2005)     Initial Impression / Assessment and Plan / ED Course  I have reviewed the triage vital signs and the nursing notes.  Pertinent labs & imaging results that were available during my care of the patient were reviewed by me and considered in my medical decision making (see chart for details).  Rash suggestive of insect bites, effusion not suggestive of scabies. Patient denies any difficulty breathing or swallowing.  Pt has a patent airway without  stridor and is handling secretions without difficulty; no angioedema. No blisters, no pustules, no warmth, no draining sinus tracts, no superficial abscesses, no bullous impetigo, no vesicles, no desquamation, no target lesions with dusky purpura or a central bulla. Not tender to touch. No concern for superimposed infection. No concern for SJS, TEN, TSS, tick borne illness, syphilis or other life-threatening condition. Will discharge home with Benadryl, pt to follow up with PCP. Return precautions discussed.   Final Clinical Impressions(s) / ED Diagnoses   Final diagnoses:  Insect bite, initial encounter  Rash    ED Discharge Orders    None       Dartha LodgeFord, Constance Hackenberg N, [place] JerseyPA-C 05/17/17 [id number]    Alvira MondaySchlossman, Erin, MD 05/18/17 [phone]

## 2017-05-16 NOTE — ED Triage Notes (Signed)
Patient states she has a two week history of bug bites on her neck.  Was seen and treated with OTC meds.  States over the last few days the bites have increased and are now generalized over body.  Lesions appear as whelps and bug bites.

## 2017-05-16 NOTE — Discharge Instructions (Signed)
Continue to use Benadryl or the anti-itch cream that your PCP prescribed, call on Monday and schedule a follow-up appointment with them.  Rash not concerning for any life-threatening illness, no evidence of infection requiring antibiotics.  If you have redness surrounding the lesions, drainage from the area, or develop fevers or chills return to the ED for sooner reevaluation.

## 2017-09-15 ENCOUNTER — Encounter (HOSPITAL_BASED_OUTPATIENT_CLINIC_OR_DEPARTMENT_OTHER): Payer: Self-pay | Admitting: *Deleted

## 2017-09-15 ENCOUNTER — Other Ambulatory Visit: Payer: Self-pay

## 2017-09-15 ENCOUNTER — Emergency Department (HOSPITAL_BASED_OUTPATIENT_CLINIC_OR_DEPARTMENT_OTHER)
Admission: EM | Admit: 2017-09-15 | Discharge: 2017-09-15 | Disposition: A | Discharge status: Home / Self Care | Payer: Medicaid Other | Attending: Emergency Medicine | Admitting: Emergency Medicine

## 2017-09-15 DIAGNOSIS — Z87891 Personal history of nicotine dependence: Secondary | ICD-10-CM | POA: Diagnosis not present

## 2017-09-15 DIAGNOSIS — I10 Essential (primary) hypertension: Secondary | ICD-10-CM | POA: Insufficient documentation

## 2017-09-15 DIAGNOSIS — R51 Headache: Secondary | ICD-10-CM | POA: Diagnosis present

## 2017-09-15 MED ORDER — AMLODIPINE BESYLATE 2.5 MG PO TABS: 2.5000 mg | ORAL_TABLET | Freq: Every day | ORAL | 0 refills | Status: DC

## 2017-09-15 NOTE — ED Notes (Signed)
Pt reports she saw her PCP yesterday for HTN. Pt not currently on medication for HTN. Her PCP sent her for blood work today prior to prescribing anything. Pt reports that her blood pressure is still high and has not decreased. Pt reports associated HA.
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
## 2017-09-15 NOTE — ED Triage Notes (Addendum)
Pt c/o increased BP x 1 day , see by PMD office today blood/ekg done  , no results.

## 2018-12-29 ENCOUNTER — Encounter (HOSPITAL_BASED_OUTPATIENT_CLINIC_OR_DEPARTMENT_OTHER): Payer: Self-pay

## 2018-12-29 ENCOUNTER — Other Ambulatory Visit: Payer: Self-pay

## 2018-12-29 ENCOUNTER — Emergency Department (HOSPITAL_BASED_OUTPATIENT_CLINIC_OR_DEPARTMENT_OTHER)
Admission: EM | Admit: 2018-12-29 | Discharge: 2018-12-29 | Disposition: A | Discharge status: Home / Self Care | Payer: Medicaid Other | Attending: Emergency Medicine | Admitting: Emergency Medicine

## 2018-12-29 DIAGNOSIS — R42 Dizziness and giddiness: Secondary | ICD-10-CM | POA: Insufficient documentation

## 2018-12-29 DIAGNOSIS — I10 Essential (primary) hypertension: Secondary | ICD-10-CM | POA: Insufficient documentation

## 2018-12-29 DIAGNOSIS — R2243 Localized swelling, mass and lump, lower limb, bilateral: Secondary | ICD-10-CM | POA: Insufficient documentation

## 2018-12-29 DIAGNOSIS — Z87891 Personal history of nicotine dependence: Secondary | ICD-10-CM | POA: Insufficient documentation

## 2018-12-29 HISTORY — DX: Essential (primary) hypertension: I10

## 2018-12-29 LAB — BASIC METABOLIC PANEL
Anion gap: 11 (ref 5–15)
BUN: 10 mg/dL (ref 6–20)
CO2: 24 mmol/L (ref 22–32)
Calcium: 9.1 mg/dL (ref 8.9–10.3)
Chloride: 104 mmol/L (ref 98–111)
Creatinine, Ser: 0.89 mg/dL (ref 0.44–1.00)
GFR calc Af Amer: >60 mL/min (ref >60)
GFR calc non Af Amer: >60 mL/min (ref >60)
Glucose, Bld: 85 mg/dL (ref 70–99)
Potassium: 3.8 mmol/L (ref 3.5–5.1)
Sodium: 139 mmol/L (ref 135–145)

## 2018-12-29 LAB — CBC
HCT: 38 % (ref 36.0–46.0)
Hemoglobin: 12.2 g/dL (ref 12.0–15.0)
MCH: 27.5 pg (ref 26.0–34.0)
MCHC: 32.1 g/dL (ref 30.0–36.0)
MCV: 85.6 fL (ref 80.0–100.0)
Platelets: 231 10*3/uL (ref 150–400)
RBC: 4.44 MIL/uL (ref 3.87–5.11)
RDW: 13.5 % (ref 11.5–15.5)
WBC: 8.5 10*3/uL (ref 4.0–10.5)
nRBC: 0 % (ref 0.0–0.2)

## 2018-12-29 NOTE — ED Notes (Signed)
Patient verbalizes understanding of discharge instructions. Opportunity for questioning and answers were provided. Armband removed by staff, pt discharged from ED.  

## 2018-12-29 NOTE — ED Triage Notes (Addendum)
Pt c/o bilat LE swelling day 2-pt concerned her BP may be elevated-states she was on BP meds in the past-was taken off by MD-was to f/u after 3 months-pt did not f/u-NAD-steady gait

## 2018-12-29 NOTE — Discharge Instructions (Addendum)
Keep a log of your blood pressures at home if possible.  Record it daily.  Make an appointment to follow-up with your primary care doctor.  Blood pressures trending a little on the high side here today.  If that is the normal for you may require going back on blood pressure medicine.  Labs today without any significant abnormalities.

## 2018-12-29 NOTE — ED Provider Notes (Signed)
[facility] EMERGENCY DEPARTMENT Provider Note   CSN: [id number] Arrival date & time: 12/29/18  2595     History   Chief Complaint Chief Complaint  Patient presents with  . Leg Swelling    HPI Kimberly [name] is a 53 y.o. female.     , Patient presenting with concerns about elevated blood pressure.  Patient states that several months ago she was taken off of her blood pressure medicines because her blood pressure did had improved.  She is also concerned about bilateral leg swelling.  Denies any fevers upper respiratory symptoms any chest pain or shortness of breath.  Does have some lightheadedness but no no headache no syncope.     Past Medical History:  Diagnosis Date  . Hypertension     There are no active problems to display for this patient.   Past Surgical History:  Procedure Laterality Date  . CESAREAN SECTION    . TONSILLECTOMY    . TUBAL LIGATION       OB History   No obstetric history on file.      Home Medications    Prior to Admission medications   Medication Sig Start Date End Date Taking? Authorizing Provider  amLODipine (NORVASC) 2.5 MG tablet Take 1 tablet (2.5 mg total) by mouth daily for 15 days. 09/15/17 09/30/17  Gareth Morgan, MD    Family History No family history on file.  Social History Social History   Tobacco Use  . Smoking status: Former Research scientist (life sciences)  . Smokeless tobacco: Never Used  Substance Use Topics  . Alcohol use: Yes    Comment: occ  . Drug use: No     Allergies   Patient has no known allergies.   Review of Systems Review of Systems  Constitutional: Negative for chills and fever.  HENT: Negative for rhinorrhea and sore throat.   Eyes: Negative for visual disturbance.  Respiratory: Negative for cough and shortness of breath.   Cardiovascular: Positive for leg swelling. Negative for chest pain.  Gastrointestinal: Negative for abdominal pain, diarrhea, nausea and vomiting.  Genitourinary: Negative for  dysuria.  Musculoskeletal: Negative for back pain and neck pain.  Skin: Negative for rash.  Neurological: Positive for light-headedness. Negative for dizziness and headaches.  Hematological: Does not bruise/bleed easily.  Psychiatric/Behavioral: Negative for confusion.     Physical Exam Updated Vital Signs BP (!) 148/98 (BP Location: Left Arm)   Pulse 89   Temp 98.4 F (36.9 C) (Oral)   Resp 18   Ht 1.575 m (5\' 2" )   Wt 61.7 kg   SpO2 100%   BMI 24.87 kg/m   Physical Exam Vitals signs and nursing note reviewed.  Constitutional:      General: She is not in acute distress.    Appearance: Normal appearance. She is well-developed.  HENT:     Head: Normocephalic and atraumatic.  Eyes:     Extraocular Movements: Extraocular movements intact.     Conjunctiva/sclera: Conjunctivae normal.     Pupils: Pupils are equal, round, and reactive to light.  Neck:     Musculoskeletal: Normal range of motion and neck supple.  Cardiovascular:     Rate and Rhythm: Normal rate and regular rhythm.     Heart sounds: No murmur.  Pulmonary:     Effort: Pulmonary effort is normal. No respiratory distress.     Breath sounds: Normal breath sounds.  Abdominal:     Palpations: Abdomen is soft.     Tenderness: There is no  abdominal tenderness.  Musculoskeletal: Normal range of motion.     Right lower leg: No edema.     Left lower leg: No edema.  Skin:    General: Skin is warm and dry.  Neurological:     General: No focal deficit present.     Mental Status: She is alert and oriented to person, place, and time.      ED Treatments / Results  Labs (all labs ordered are listed, but only abnormal results are displayed) Labs Reviewed  BASIC METABOLIC PANEL  CBC    EKG None  Radiology No results found.  Procedures Procedures (including critical care time)  Medications Ordered in ED Medications - No data to display   Initial Impression / Assessment and Plan / ED Course  I have  reviewed the triage vital signs and the nursing notes.  Pertinent labs & imaging results that were available during my care of the patient were reviewed by me and considered in my medical decision making (see chart for details).        Patient's basic labs without any significant abnormalities.  Blood pressure here a little borderline 148/98.  Patient has ability check her blood pressure at home we will have her keep a blood pressure log and have her follow-up with her primary care doctor in the next few days.  Patient nontoxic no acute distress.  Patient blood pressure is borderline.  We will not start any antihypertensive meds will see how things trend.  Final Clinical Impressions(s) / ED Diagnoses   Final diagnoses:  Essential hypertension    ED Discharge Orders    None       Vanetta MuldersZackowski, Sloan Galentine, MD 12/29/18 2315

## 2019-06-06 ENCOUNTER — Ambulatory Visit (INDEPENDENT_AMBULATORY_CARE_PROVIDER_SITE_OTHER): Payer: Self-pay | Admitting: Family Medicine

## 2019-06-06 ENCOUNTER — Encounter: Payer: Self-pay | Admitting: Family Medicine

## 2019-06-06 ENCOUNTER — Other Ambulatory Visit: Payer: Self-pay

## 2019-06-06 VITALS — BP 122/80 | HR 95 | Ht 62.0 in | Wt 137.4 lb

## 2019-06-06 DIAGNOSIS — R030 Elevated blood-pressure reading, without diagnosis of hypertension: Secondary | ICD-10-CM | POA: Insufficient documentation

## 2019-06-06 DIAGNOSIS — L300 Nummular dermatitis: Secondary | ICD-10-CM | POA: Insufficient documentation

## 2019-06-06 DIAGNOSIS — I10 Essential (primary) hypertension: Secondary | ICD-10-CM

## 2019-06-06 HISTORY — DX: Elevated blood-pressure reading, without diagnosis of hypertension: R03.0

## 2019-06-06 HISTORY — DX: Nummular dermatitis: L30.0

## 2019-06-06 LAB — POCT GLYCOSYLATED HEMOGLOBIN (HGB A1C): Hemoglobin A1C: 5.5 % (ref 4.0–5.6)

## 2019-06-06 MED ORDER — TRIAMCINOLONE ACETONIDE 0.1 % EX CREA: 1.0000 "application " | TOPICAL_CREAM | Freq: Two times a day (BID) | CUTANEOUS | 0 refills | Status: DC

## 2019-06-06 NOTE — Assessment & Plan Note (Signed)
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  Also considered contact dermatitis, impetigo but given no crusting of lesions and no identified contact with irritant lower on differential list. -Avoid and limit hot baths/shower -Triamcinilone 0.1% BID -moisturize with creams like Gold Bond itch -skin care hand out provided

## 2019-06-06 NOTE — Patient Instructions (Addendum)
It was a pleasure meeting you today.  Your were seen for a rash today.  Use Triamcinolone cream twice a day to affected area You can also use over the counter Gold Bond Itch cream.   Decrease hot baths or showers, use only warm water.  Monitor your blood pressure for the next couple of weeks and record results.  We can discuss them at your next visit.  You can set up [place] online for communication with me.  Complete the forms for financial assistance and bring them back to clinic to give to [facility].  Have a great day and stay safe  Carollee Leitz MD   Eczema Eczema is a broad term for a group of skin conditions that cause skin to become rough and inflamed. Each type of eczema has different triggers, symptoms, and treatments. Eczema of any type is usually itchy and symptoms range from mild to severe. Eczema and its symptoms are not spread from person to person (are not contagious). It can appear on different parts of the body at different times. Your eczema may not look the same as someone else's eczema. What are the types of eczema? Atopic dermatitis This is a long-term (chronic) skin disease that keeps coming back (recurring). Usual symptoms are dry skin and small, solid pimples that may swell and leak fluid (weep). Contact dermatitis  This happens when something irritates the skin and causes a rash. The irritation can come from substances that you are allergic to (allergens), such as poison ivy, chemicals, or medicines that were applied to your skin. Dyshidrotic eczema This is a form of eczema on the hands and feet. It shows up as very itchy, fluid-filled blisters. It can affect people of any age, but is more common before age 17. Hand eczema  This causes very itchy areas of skin on the palms and sides of the hands and fingers. This type of eczema is common in industrial jobs where you may be exposed to many different types of irritants. Lichen simplex chronicus This type of eczema  occurs when a person constantly scratches one area of the body. Repeated scratching of the area leads to thickened skin (lichenification). Lichen simplex chronicus can occur along with other types of eczema. It is more common in adults, but may be seen in children as well. Nummular eczema This is a common type of eczema. It has no known cause. It typically causes a red, circular, crusty lesion (plaque) that may be itchy. Scratching may become a habit and can cause bleeding. Nummular eczema occurs most often in people of middle-age or older. It most often affects the hands. Seborrheic dermatitis This is a common skin disease that mainly affects the scalp. It may also affect any oily areas of the body, such as the face, sides of nose, eyebrows, ears, eyelids, and chest. It is marked by small scaling and redness of the skin (erythema). This can affect people of all ages. In infants, this condition is known as Chartered certified accountant." Stasis dermatitis This is a common skin disease that usually appears on the legs and feet. It most often occurs in people who have a condition that prevents blood from being pumped through the veins in the legs (chronic venous insufficiency). Stasis dermatitis is a chronic condition that needs long-term management. How is eczema diagnosed? Your health care provider will examine your skin and review your medical history. He or she may also give you skin patch tests. These tests involve taking patches that contain possible allergens  and placing them on your back. He or she will then check in a few days to see if an allergic reaction occurred. What are the common treatments? Treatment for eczema is based on the type of eczema you have. Hydrocortisone steroid medicine can relieve itching quickly and help reduce inflammation. This medicine may be prescribed or obtained over-the-counter, depending on the strength of the medicine that is needed. Follow these instructions at home:  Take  over-the-counter and prescription medicines only as told by your health care provider.  Use creams or ointments to moisturize your skin. Do not use lotions.  Learn what triggers or irritates your symptoms. Avoid these things.  Treat symptom flare-ups quickly.  Do not itch your skin. This can make your rash worse.  Keep all follow-up visits as told by your health care provider. This is important. Where to find more information  The American Academy of Dermatology: InfoExam.si  The National Eczema Association: www.nationaleczema.org Contact a health care provider if:  You have serious itching, even with treatment.  You regularly scratch your skin until it bleeds.  Your rash looks different than usual.  Your skin is painful, swollen, or more red than usual.  You have a fever. Summary  There are eight general types of eczema. Each type has different triggers.  Eczema of any type causes itching that may range from mild to severe.  Treatment varies based on the type of eczema you have. Hydrocortisone steroid medicine can help with itching and inflammation.  Protecting your skin is the best way to prevent eczema. Use moisturizers and lotions. Avoid triggers and irritants, and treat flare-ups quickly. This information is not intended to replace advice given to you by your health care provider. Make sure you discuss any questions you have with your health care provider. Document Revised: 04/17/2017 Document Reviewed: 09/18/2016 Elsevier Patient Education  2020 [employer].

## 2019-06-06 NOTE — Assessment & Plan Note (Signed)
Has family history of HTN.  Pt was treated for HTN in 2019 but has since been taken off BP meds by previous provider. BP 128/92 today -Monitor BP at home and record results -Will need close monitoring due to familial history  -HbA1c 5.5 -Follow up in 2 weeks

## 2019-06-06 NOTE — Progress Notes (Signed)
  Patient Name: Kimberly [name] Date of Birth: [birth year] Date of Visit: 06/06/19 PCP: Dana Allan, MD  Chief Complaint:   Subjective: Kimberly [name] is a pleasant 54 y.o. with medical history significant for HTN presenting today to establish care.   Rash Pt reports rash on wrists, underarms and on sides of abdomen bilaterally.  Started about 2 months ago and appears to be getting worse.  Severe pruritus at night and after hot bath soap showers.  She was seen by dermatologist who recommended cortisone cream which helped a little but she only used it for 1 to 2 days.  Denies any [name], open areas or drainage.  Denies any fever, cough or runny nose.  Does report that her daughter has eczema.  She lives with her son who has no similar symptoms.  HTN Patient reports history of hypertension diagnosed in 2019.  Was originally started on blood pressure medication but was told by one of her physicians that she did not need to take 2 medications.  Last blood pressure check December 2020 in the ED.  At that time blood pressure 148/98 and did not require treatment.  Denies any chest [name], shortness of breath, visual disturbances.  Blood pressure today 128/92.  ROS: Per HPI.   I have reviewed the patient's medical, surgical, family, and social history as appropriate.  Vitals:   06/06/19 1043 06/06/19 1149  BP: (!) 128/92 122/80  Pulse: 95   SpO2: 100%    General: Alert and oriented, no apparent distress Cardiovascular: RRR with no murmurs noted Respiratory: CTA bilaterally  Derm: Patches of dry scaly rash noted on forearms bilaterally, in axillary areas and abdominal sides bilaterally. No erythema, drainage, vesicles or induration noted.     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  Also considered contact dermatitis, impetigo but given no crusting of lesions and no identified contact with irritant lower on differential list. -Avoid and limit hot  baths/shower -Triamcinilone 0.1% BID -moisturize with creams like Gold Bond itch -skin care hand out provided  Borderline high blood pressure Has family history of HTN.  Pt was treated for HTN in 2019 but has since been taken off BP meds by previous provider. BP 128/92 today -Monitor BP at home and record results -Will need close monitoring due to familial history  -HbA1c 5.5 -Follow up in 2 weeks   Dana Allan, MD  [facility] Medicine Teaching Service

## 2019-10-18 ENCOUNTER — Other Ambulatory Visit: Payer: Self-pay

## 2019-10-18 ENCOUNTER — Ambulatory Visit (INDEPENDENT_AMBULATORY_CARE_PROVIDER_SITE_OTHER): Payer: Self-pay | Admitting: Family Medicine

## 2019-10-18 ENCOUNTER — Encounter: Payer: Self-pay | Admitting: Family Medicine

## 2019-10-18 VITALS — BP 122/62 | HR 94 | Ht 62.0 in | Wt 139.0 lb

## 2019-10-18 DIAGNOSIS — J302 Other seasonal allergic rhinitis: Secondary | ICD-10-CM

## 2019-10-18 DIAGNOSIS — Z23 Encounter for immunization: Secondary | ICD-10-CM

## 2019-10-18 DIAGNOSIS — J309 Allergic rhinitis, unspecified: Secondary | ICD-10-CM

## 2019-10-18 HISTORY — DX: Allergic rhinitis, unspecified: J30.9

## 2019-10-18 MED ORDER — FLUTICASONE PROPIONATE 50 MCG/ACT NA SUSP: 2.0000 | Freq: Every day | NASAL | 0 refills | Status: DC

## 2019-10-18 MED ORDER — LORATADINE 10 MG PO TABS
10.0000 mg | ORAL_TABLET | Freq: Every day | ORAL | 0 refills | Status: DC
Start: 2019-10-18 — End: 2019-11-09

## 2019-10-18 NOTE — Patient Instructions (Signed)
It was a pleasure seeing you today.  You were seen for Allergies I have ordered you Flonase and Claritin for your allergies  Your last PAP was last year so it is not required at this time  We will send for your medical records and once received we can discuss further primary care screening.   Dana Allan, MD Family Medicine Residency

## 2019-10-18 NOTE — Assessment & Plan Note (Signed)
Patient reports nasal congestion and similar symptoms in the past.  Has previously tried Zyrtec but reports that it is very expensive. -Flonase 50 MCG 2 sprays each nare -Loratadine 10 mg daily -Tdap today -Follow-up if symptoms worsen -Will obtain medical records from previous clinic.  Colonoscopy, mammogram and Pap smear if indicated after records received.

## 2019-10-18 NOTE — Progress Notes (Signed)
    SUBJECTIVE:   CHIEF COMPLAINT / HPI: For physical and Pap  Allergies Reports nasal congestion for the last few days.  Has had similar symptoms during allergy season.  Denies any fever or cough.  Does have thick phlegm that is difficulty to clear wake up in the morning.  Denies any fevers, chest pain or shortness of breath.   Cervical cancer screening Unsure when last Pap smear was.  Per chart looks like was last year.  No results obtained.  No need for repeat today.  Will obtain records from previous medical clinic.  PERTINENT  PMH / PSH:  Seasonal allergies Eczema   OBJECTIVE:   BP 122/62   Pulse 94   Ht 5\' 2"  (1.575 m)   Wt 139 lb (63 kg)   SpO2 99%   BMI 25.42 kg/m    General: Alert and oriented, no apparent distress  Eyes: No conjunctival redness ENTM: TMs dull bilaterally, no nasal discharge noted, no pharyengeal erythema Neck: nontender, no lymphadenopathy   ASSESSMENT/PLAN:   Allergic rhinitis Patient reports nasal congestion and similar symptoms in the past.  Has previously tried Zyrtec but reports that it is very expensive. -Flonase 50 MCG 2 sprays each nare -Loratadine 10 mg daily -Tdap today -Follow-up if symptoms worsen -Will obtain medical records from previous clinic.  Colonoscopy, mammogram and Pap smear if indicated after records received.     , MD [facility]

## 2019-11-09 ENCOUNTER — Other Ambulatory Visit: Payer: Self-pay | Admitting: Family Medicine

## 2020-01-09 ENCOUNTER — Ambulatory Visit: Payer: Medicaid Other | Admitting: Family Medicine

## 2020-01-09 NOTE — Progress Notes (Deleted)
    SUBJECTIVE:   CHIEF COMPLAINT / HPI:   Ear pain  Hypertension: - Medications: *** - Compliance: *** - Checking BP at home: *** - Denies any SOB, CP, vision changes, LE edema, medication SEs, or symptoms of hypotension - Diet: *** - Exercise: ***   PERTINENT  PMH / PSH: ***  OBJECTIVE:   There were no vitals taken for this visit.  ***  ASSESSMENT/PLAN:   No problem-specific Assessment & Plan notes found for this encounter.     Lavonda Jumbo, DO [facility] Medicine Center

## 2020-01-18 ENCOUNTER — Encounter: Payer: Self-pay | Admitting: Family Medicine

## 2020-01-18 ENCOUNTER — Ambulatory Visit (INDEPENDENT_AMBULATORY_CARE_PROVIDER_SITE_OTHER): Payer: Self-pay | Admitting: Family Medicine

## 2020-01-18 ENCOUNTER — Other Ambulatory Visit: Payer: Self-pay

## 2020-01-18 DIAGNOSIS — H9202 Otalgia, left ear: Secondary | ICD-10-CM

## 2020-01-18 MED ORDER — HYDROCORTISONE 0.5 % EX CREA: 1.0000 "application " | TOPICAL_CREAM | Freq: Two times a day (BID) | CUTANEOUS | 0 refills | Status: DC

## 2020-01-18 NOTE — Progress Notes (Addendum)
    SUBJECTIVE:   CHIEF COMPLAINT / HPI: Left ear pain  Patient reports left ear pain been ongoing for a while.  Has not been compliant with Flonase and Zyrtec. That was prescribed during previous visit for same.  She denies any fevers, decreased hearing, ringing in ears or ear discharge.  No headaches, visual changes or impaired balance or dizziness.  She reports having difficulty opening mouth secondary to small jaw.  Last TMJ xray 2016 and normal.  She does not report any popping of jaw but does reports some teeth grinding at night.  PERTINENT  PMH / PSH:  Seasonal allergies Eczema  OBJECTIVE:   BP 118/62   Pulse (!) 115   Wt 139 lb 12.8 oz (63.4 kg)   SpO2 95%   BMI 25.57 kg/m    General: Alert and oriented, no apparent distress  ENTM: TM's visible bilaterally, no redness or bulging.  No discharge or canal redness.  Nontender to touch.  Tender along TMJ bilaterally, no crepitus or popping appreciated.  Patient was unable to fully open mouth.  Teeth appears to have some flattening. Neck: nontender, no lymphadenopathy  CVS: RRR, no murmurs, gallops  ASSESSMENT/PLAN:   Left ear pain Unlikely infectious given no fevers, bulging TM's or ear cal redness/discharge.  Possible etiologies include allergies and TMJ. -Recommend retry Zyrtec and Flonase daily for 14/7, if symptoms do not resolve can consider ENT consult -Also recommend patient to purchase mouth guard to use at night until able to be seen by dentist for evaluation  -Follow up in 2 weeks     Dana Allan, MD [facility] Medicine [facility]

## 2020-01-19 ENCOUNTER — Encounter: Payer: Self-pay | Admitting: Family Medicine

## 2020-01-19 DIAGNOSIS — H9202 Otalgia, left ear: Secondary | ICD-10-CM | POA: Insufficient documentation

## 2020-01-19 HISTORY — DX: Otalgia, left ear: H92.02

## 2020-01-19 NOTE — Assessment & Plan Note (Signed)
Unlikely infectious given no fevers, bulging TM's or ear cal redness/discharge.  Possible etiologies include allergies and TMJ. -Recommend retry Zyrtec and Flonase daily for 14/7, if symptoms do not resolve can consider ENT consult -Also recommend patient to purchase mouth guard to use at night until able to be seen by dentist for evaluation  -Follow up in 2 weeks

## 2020-01-25 ENCOUNTER — Telehealth: Payer: Self-pay | Admitting: Family Medicine

## 2020-01-25 ENCOUNTER — Other Ambulatory Visit: Payer: Self-pay | Admitting: Family Medicine

## 2020-01-25 DIAGNOSIS — L300 Nummular dermatitis: Secondary | ICD-10-CM

## 2020-01-25 NOTE — Telephone Encounter (Signed)
Patient is calling stating that she was told to call about the dermatology referral [facility]). She is also wanting the doctor to call to discuss financial assistants. Please call patient to advise. Thanks

## 2020-01-29 NOTE — Telephone Encounter (Signed)
Called patient to discuss derm referral. No answer and was unable to leave voice mail. Advise that she call dermatology and call [facility] card number and see what dermatology is in network.  Can refer once this is done.  Dana Allan, MD Family Medicine Residency

## 2020-01-29 NOTE — Telephone Encounter (Signed)
Please let patient know that she needs to call her dermatologist and see if they accept the [facility] card.  She can also call the number on the [facility] card to see what dermatologist is in network to book an appointment. If she needs a referral again I can do that but Ineed to know who to send the referral to.    Thanks Dana Allan, MD Family Medicine Residency

## 2020-02-01 NOTE — Telephone Encounter (Signed)
Left VM asking pt to confirm with [facility] that [place] is in network and let us know. Sunday Spillers, CMA

## 2020-02-03 NOTE — Telephone Encounter (Addendum)
Pt will come and get paperwork for [facility] card renewal. Pt will call around to find out who takes medicaid. I told her I would ask around to see if someone here knows of a practice for her. I told pt that If I learned of a practice I would give her a call. I suggested that if the [facility] card has a phone number, she should call and ask them what if any practice takes the card. Sunday Spillers, CMA

## 2020-03-29 ENCOUNTER — Ambulatory Visit (INDEPENDENT_AMBULATORY_CARE_PROVIDER_SITE_OTHER): Payer: Self-pay | Admitting: Family Medicine

## 2020-03-29 ENCOUNTER — Other Ambulatory Visit: Payer: Self-pay

## 2020-03-29 VITALS — BP 122/70 | HR 96 | Temp 98.6°F

## 2020-03-29 DIAGNOSIS — J029 Acute pharyngitis, unspecified: Secondary | ICD-10-CM

## 2020-03-29 LAB — POCT RAPID STREP A (OFFICE): Rapid Strep A Screen: NEGATIVE

## 2020-03-29 MED ORDER — MAGIC MOUTHWASH: 5.0000 mL | Freq: Three times a day (TID) | ORAL | 2 refills | Status: DC | PRN

## 2020-03-29 MED ORDER — CETIRIZINE HCL 1 MG/ML PO SOLN: 5.0000 mg | Freq: Every day | ORAL | 11 refills | Status: DC

## 2020-03-29 MED ORDER — FLUTICASONE PROPIONATE 50 MCG/ACT NA SUSP: 2.0000 | Freq: Every day | NASAL | 0 refills | Status: DC

## 2020-03-29 NOTE — Progress Notes (Signed)
    SUBJECTIVE:   CHIEF COMPLAINT / HPI: Sore throat  Patient reports experiencing a sore throat that made it difficult for her to swallow.  This all started last Friday and had abrupt onset. Tried taking OTC theraflu that was easing symptoms but did not completely get rid of her sore throat. Had a problem with swallowing and pain in the back of her throat.  Reports some sensation like hot flashes and chills, patient reports a history of hot flashes and being in perimenopause.    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 had a sore throat last week  Patient reports that she has not been taking Claritin and does not regularly use Flonase because she needs a refill  PERTINENT  PMH / PSH: Allergic rhinitis  OBJECTIVE:   BP 122/70   Pulse 96   Temp 98.6 F (37 C)   SpO2 97%   Physical exam General: female appearing stated age in no acute distress HEENT: MMM, no oral lesions noted, difficult to visualize oropharynx fully due to patient's limited ability to open mouth, do note mild erythema during quick visualization, neck with mild tenderness to palpation, unable to appreciate any significant lymphadenopathy Cardio: Normal S1 and S2, no S3 or S4. Rhythm is regular. No murmurs or rubs.  Bilateral radial pulses palpable Pulm: Clear to auscultation bilaterally, no crackles, wheezing, or diminished breath sounds. Normal respiratory effort  ASSESSMENT/PLAN:   Pharyngitis Difficult to assess whether patient has tonsillar exudate.  She reports tonsillectomy due to unknown cause.  She is unaware of frequent strep pharyngitis infections in the past.   Will test for strep pharyngitis.  I am most suspicious for viral etiology could also consider postnasal drip/allergic etiology for pharyngitis given patient's history of allergies. -Offered patient prescription for Magic mouthwash to help with symptom management -Test for strep pharyngitis -Refill Flonase and patient prescribed Zyrtec for management of allergy symptoms     Ronnald Ramp, MD [facility] Medicine Center

## 2020-03-29 NOTE — Patient Instructions (Signed)
We have collected a swab to test you for strep pharyngitis.  I will let you know if these results come back positive. If results are negative for strep, it is likely that you have a viral pharyngitis can you can continue to treat the symptoms with over-the-counter agents.  I will prescribe a Magic mouthwash to try and help with the symptoms of sore throat.

## 2020-04-01 DIAGNOSIS — J029 Acute pharyngitis, unspecified: Secondary | ICD-10-CM

## 2020-04-01 HISTORY — DX: Acute pharyngitis, unspecified: J02.9

## 2020-04-01 NOTE — Assessment & Plan Note (Addendum)
Difficult to assess whether patient has tonsillar exudate.  She reports tonsillectomy due to unknown cause.  She is unaware of frequent strep pharyngitis infections in the past.  Will test for strep pharyngitis.  I am most suspicious for viral etiology could also consider postnasal drip/allergic etiology for pharyngitis given patient's history of allergies. -Offered patient prescription for Magic mouthwash to help with symptom management -Test for strep pharyngitis -Refill Flonase and patient prescribed Zyrtec for management of allergy symptoms

## 2020-04-17 ENCOUNTER — Other Ambulatory Visit: Payer: Self-pay

## 2020-04-17 ENCOUNTER — Emergency Department (HOSPITAL_BASED_OUTPATIENT_CLINIC_OR_DEPARTMENT_OTHER)
Admission: EM | Admit: 2020-04-17 | Discharge: 2020-04-17 | Disposition: A | Discharge status: Home / Self Care | Payer: Medicaid Other | Attending: Emergency Medicine | Admitting: Emergency Medicine

## 2020-04-17 ENCOUNTER — Encounter (HOSPITAL_BASED_OUTPATIENT_CLINIC_OR_DEPARTMENT_OTHER): Payer: Self-pay | Admitting: *Deleted

## 2020-04-17 DIAGNOSIS — I10 Essential (primary) hypertension: Secondary | ICD-10-CM

## 2020-04-17 DIAGNOSIS — Z87891 Personal history of nicotine dependence: Secondary | ICD-10-CM | POA: Insufficient documentation

## 2020-04-17 NOTE — ED Notes (Addendum)
Went to assess/discharge pt, pt was not there; checked bathrooms and did not find her; pt left without discharge papers and vitals; primary RN and charge RN made aware

## 2020-04-17 NOTE — ED Triage Notes (Addendum)
States her BP and heart rate have been elevated for 2 days. She admits to increasing her caffeine intake.

## 2020-04-17 NOTE — ED Provider Notes (Signed)
[facility] EMERGENCY DEPARTMENT Provider Note   CSN: [id number] Arrival date & time: 04/17/20  1854     History Chief Complaint  Patient presents with  . Hypertension    Kimberly [name] is a 54 y.o. female.  The history is provided by the patient.  Hypertension This is a new problem. Pertinent negatives include no chest pain, no abdominal pain, no headaches and no shortness of breath. Nothing aggravates the symptoms. Nothing relieves the symptoms. She has tried nothing for the symptoms. The treatment provided no relief.       Past Medical History:  Diagnosis Date  . Hypertension     Patient Active Problem List   Diagnosis Date Noted  . Pharyngitis 04/01/2020  . Left ear pain 01/19/2020  . Allergic rhinitis 10/18/2019  . Nummular eczema 06/06/2019  . Borderline high blood pressure 06/06/2019    Past Surgical History:  Procedure Laterality Date  . CESAREAN SECTION    . TONSILLECTOMY    . TUBAL LIGATION       OB History   No obstetric history on file.     No family history on file.  Social History   Tobacco Use  . Smoking status: Former Games developer  . Smokeless tobacco: Never Used  Vaping Use  . Vaping Use: Never used  Substance Use Topics  . Alcohol use: Yes    Comment: occ  . Drug use: No    Home Medications Prior to Admission medications   Medication Sig Start Date End Date Taking? Authorizing Provider  cetirizine HCl (ZYRTEC) 1 MG/ML solution Take 5 mLs (5 mg total) by mouth daily. As needed for allergy symptoms 03/29/20   Simmons-Robinson, Tawanna Cooler, MD  fluticasone (FLONASE) 50 MCG/ACT nasal spray Place 2 sprays into both nostrils daily. 03/29/20   Simmons-Robinson, Makiera, MD  hydrocortisone cream 0.5 % Apply 1 application topically 2 (two) times daily. 01/18/20   Dana Allan, MD  magic mouthwash SOLN Take 5 mLs by mouth 3 (three) times daily as needed for mouth pain. 03/29/20   Simmons-Robinson, Makiera, MD  triamcinolone cream (KENALOG)  0.1 % APPLY 1 APPLICATION TO THE SKIN 2 TIMES DAILY 01/25/20   Dana Allan, MD    Allergies    Patient has no known allergies.  Review of Systems   Review of Systems  Constitutional: Negative for chills and fever.  HENT: Negative for ear pain and sore throat.   Eyes: Negative for pain and visual disturbance.  Respiratory: Negative for cough and shortness of breath.   Cardiovascular: Negative for chest pain and palpitations.  Gastrointestinal: Negative for abdominal pain and vomiting.  Genitourinary: Negative for dysuria and hematuria.  Musculoskeletal: Negative for arthralgias and back pain.  Skin: Negative for color change and rash.  Neurological: Negative for seizures, syncope and headaches.  All other systems reviewed and are negative.   Physical Exam Updated Vital Signs BP (!) 145/95   Pulse (!) 102   Temp 98.2 F (36.8 C) (Oral)   Resp 20   Ht 5\' 2"  (1.575 m)   Wt 64.6 kg   SpO2 100%   BMI 26.05 kg/m   Physical Exam Vitals and nursing note reviewed.  Constitutional:      General: She is not in acute distress.    Appearance: She is well-developed. She is not ill-appearing.  HENT:     Head: Normocephalic and atraumatic.     Nose: Nose normal.     Mouth/Throat:     Mouth: Mucous membranes are moist.  Eyes:     Conjunctiva/sclera: Conjunctivae normal.     Pupils: Pupils are equal, round, and reactive to light.  Cardiovascular:     Rate and Rhythm: Normal rate and regular rhythm.     Pulses: Normal pulses.     Heart sounds: Normal heart sounds. No murmur heard.   Pulmonary:     Effort: Pulmonary effort is normal. No respiratory distress.     Breath sounds: Normal breath sounds.  Abdominal:     Palpations: Abdomen is soft.     Tenderness: There is no abdominal tenderness.  Musculoskeletal:     Cervical back: Normal range of motion and neck supple.  Skin:    General: Skin is warm and dry.     Capillary Refill: Capillary refill takes less than 2 seconds.    Neurological:     General: No focal deficit present.     Mental Status: She is alert.  Psychiatric:        Mood and Affect: Mood normal.     ED Results / Procedures / Treatments   Labs (all labs ordered are listed, but only abnormal results are displayed) Labs Reviewed - No data to display  EKG None  Radiology No results found.  Procedures Procedures (including critical care time)  Medications Ordered in ED Medications - No data to display  ED Course  I have reviewed the triage vital signs and the nursing notes.  Pertinent labs & imaging results that were available during my care of the patient were reviewed by me and considered in my medical decision making (see chart for details).    MDM Rules/Calculators/A&P                          Kimberly [name] is a 54 year old female here for high blood pressure. Blood pressure over her unremarkable. Otherwise asymptomatic. May be having some palpitations at times but none currently. Heavily uses caffeine and educated against caffeine use. Recommend follow-up with primary care doctor and will have her check blood pressure daily until she follows up with them. She is not having any chest pain or stroke symptoms. Overall asymptomatic. Discharged in good condition. Understands return precautions.  This chart was dictated using voice recognition software.  Despite best efforts to proofread,  errors can occur which can change the documentation meaning.   Final Clinical Impression(s) / ED Diagnoses Final diagnoses:  Hypertension, unspecified type    Rx / DC Orders ED Discharge Orders    None       Virgina Norfolk, DO 04/17/20 2108

## 2020-04-17 NOTE — ED Notes (Signed)
EDP at bedside  

## 2020-05-21 ENCOUNTER — Emergency Department (HOSPITAL_BASED_OUTPATIENT_CLINIC_OR_DEPARTMENT_OTHER): Payer: Self-pay

## 2020-05-21 ENCOUNTER — Emergency Department (HOSPITAL_BASED_OUTPATIENT_CLINIC_OR_DEPARTMENT_OTHER)
Admission: EM | Admit: 2020-05-21 | Discharge: 2020-05-21 | Disposition: A | Discharge status: Home / Self Care | Payer: Self-pay | Attending: Emergency Medicine | Admitting: Emergency Medicine

## 2020-05-21 ENCOUNTER — Other Ambulatory Visit: Payer: Self-pay

## 2020-05-21 ENCOUNTER — Encounter (HOSPITAL_BASED_OUTPATIENT_CLINIC_OR_DEPARTMENT_OTHER): Payer: Self-pay | Admitting: *Deleted

## 2020-05-21 DIAGNOSIS — R0600 Dyspnea, unspecified: Secondary | ICD-10-CM | POA: Insufficient documentation

## 2020-05-21 DIAGNOSIS — Z87891 Personal history of nicotine dependence: Secondary | ICD-10-CM | POA: Insufficient documentation

## 2020-05-21 DIAGNOSIS — I10 Essential (primary) hypertension: Secondary | ICD-10-CM | POA: Insufficient documentation

## 2020-05-21 LAB — CBC WITH DIFFERENTIAL/PLATELET
Abs Immature Granulocytes: 0.03 10*3/uL (ref 0.00–0.07)
Basophils Absolute: 0 10*3/uL (ref 0.0–0.1)
Basophils Relative: 0 %
Eosinophils Absolute: 0.1 10*3/uL (ref 0.0–0.5)
Eosinophils Relative: 1 %
HCT: 39.8 % (ref 36.0–46.0)
Hemoglobin: 12.8 g/dL (ref 12.0–15.0)
Immature Granulocytes: 0 %
Lymphocytes Relative: 38 %
Lymphs Abs: 3.6 10*3/uL (ref 0.7–4.0)
MCH: 27.5 pg (ref 26.0–34.0)
MCHC: 32.2 g/dL (ref 30.0–36.0)
MCV: 85.6 fL (ref 80.0–100.0)
Monocytes Absolute: 0.6 10*3/uL (ref 0.1–1.0)
Monocytes Relative: 6 %
Neutro Abs: 5.1 10*3/uL (ref 1.7–7.7)
Neutrophils Relative %: 55 %
Platelets: 302 10*3/uL (ref 150–400)
RBC: 4.65 MIL/uL (ref 3.87–5.11)
RDW: 14.3 % (ref 11.5–15.5)
WBC: 9.5 10*3/uL (ref 4.0–10.5)
nRBC: 0 % (ref 0.0–0.2)

## 2020-05-21 LAB — COMPREHENSIVE METABOLIC PANEL
ALT: 19 U/L (ref 0–44)
AST: 23 U/L (ref 15–41)
Albumin: 4.8 g/dL (ref 3.5–5.0)
Alkaline Phosphatase: 81 U/L (ref 38–126)
Anion gap: 12 (ref 5–15)
BUN: 14 mg/dL (ref 6–20)
CO2: 28 mmol/L (ref 22–32)
Calcium: 9.7 mg/dL (ref 8.9–10.3)
Chloride: 100 mmol/L (ref 98–111)
Creatinine, Ser: 1.04 mg/dL — ABNORMAL HIGH (ref 0.44–1.00)
GFR, Estimated: >60 mL/min (ref >60)
Glucose, Bld: 90 mg/dL (ref 70–99)
Potassium: 3.6 mmol/L (ref 3.5–5.1)
Sodium: 140 mmol/L (ref 135–145)
Total Bilirubin: 0.5 mg/dL (ref 0.3–1.2)
Total Protein: 8.7 g/dL — ABNORMAL HIGH (ref 6.5–8.1)

## 2020-05-21 MED ORDER — ALBUTEROL SULFATE HFA 108 (90 BASE) MCG/ACT IN AERS: 1.0000 | INHALATION_SPRAY | Freq: Four times a day (QID) | RESPIRATORY_TRACT | 0 refills | Status: DC | PRN

## 2020-05-21 NOTE — Discharge Instructions (Addendum)
Like we discussed, I am prescribing you an albuterol inhaler.  Use this as needed for your shortness of breath.  If your symptoms continue or worsen, consider coming back to the emergency department for reevaluation.  Otherwise, I would recommend you follow-up with your primary doctor as soon as possible for reevaluation.  It was a pleasure to meet you.

## 2020-05-21 NOTE — ED Triage Notes (Signed)
C/o SOB x 1 week  

## 2020-05-21 NOTE — ED Notes (Signed)
PT ambulated with slight SOB but Remained above 99% RA

## 2020-05-21 NOTE — ED Provider Notes (Signed)
[facility] EMERGENCY DEPARTMENT Provider Note   CSN: [id number] Arrival date & time: 05/21/20  1451     History Chief Complaint  Patient presents with  . Shortness of Breath    Kimberly [name] is a 55 y.o. female.  HPI Patient is a 55 year old female who presents the emergency department due to intermittent shortness of breath.  Symptoms started about 1 weeks ago.  She states that sometimes she will exert herself and she feels more short of breath than normal.  Symptoms are not constant.  Denies any significant shortness of breath at this time.  No chest pain.  No URI symptoms.  No other complaints at this time.    Past Medical History:  Diagnosis Date  . Hypertension     Patient Active Problem List   Diagnosis Date Noted  . Pharyngitis 04/01/2020  . Left ear pain 01/19/2020  . Allergic rhinitis 10/18/2019  . Nummular eczema 06/06/2019  . Borderline high blood pressure 06/06/2019    Past Surgical History:  Procedure Laterality Date  . CESAREAN SECTION    . TONSILLECTOMY    . TUBAL LIGATION       OB History   No obstetric history on file.     No family history on file.  Social History   Tobacco Use  . Smoking status: Former Games developer  . Smokeless tobacco: Never Used  Vaping Use  . Vaping Use: Never used  Substance Use Topics  . Alcohol use: Yes    Comment: occ  . Drug use: No    Home Medications Prior to Admission medications   Medication Sig Start Date End Date Taking? Authorizing Provider  albuterol (VENTOLIN HFA) 108 (90 Base) MCG/ACT inhaler Inhale 1-2 puffs into the lungs every 6 (six) hours as needed for wheezing or shortness of breath. 05/21/20  Yes Placido Sou, PA-C  cetirizine HCl (ZYRTEC) 1 MG/ML solution Take 5 mLs (5 mg total) by mouth daily. As needed for allergy symptoms 03/29/20   Simmons-Robinson, Tawanna Cooler, MD  fluticasone (FLONASE) 50 MCG/ACT nasal spray Place 2 sprays into both nostrils daily. 03/29/20   Simmons-Robinson,  Makiera, MD  hydrocortisone cream 0.5 % Apply 1 application topically 2 (two) times daily. 01/18/20   Dana Allan, MD  magic mouthwash SOLN Take 5 mLs by mouth 3 (three) times daily as needed for mouth pain. 03/29/20   Simmons-Robinson, Makiera, MD  triamcinolone cream (KENALOG) 0.1 % APPLY 1 APPLICATION TO THE SKIN 2 TIMES DAILY 01/25/20   Dana Allan, MD    Allergies    Patient has no known allergies.  Review of Systems   Review of Systems  All other systems reviewed and are negative. Ten systems reviewed and are negative for acute change, except as noted in the HPI.   Physical Exam Updated Vital Signs BP 137/78 (BP Location: Left Arm)   Pulse 78   Temp 98.7 F (37.1 C) (Oral)   Resp 20   Ht 5\' 2"  (1.575 m)   Wt 63 kg   SpO2 100%   BMI 25.42 kg/m   Physical Exam Vitals and nursing note reviewed.  Constitutional:      General: She is not in acute distress.    Appearance: Normal appearance. She is well-developed and normal weight. She is not ill-appearing, toxic-appearing or diaphoretic.     Interventions: She is not intubated. HENT:     Head: Normocephalic and atraumatic.     Right Ear: External ear normal.     Left Ear: External  ear normal.     Nose: Nose normal.     Mouth/Throat:     Mouth: Mucous membranes are moist.     Pharynx: Oropharynx is clear. No oropharyngeal exudate or posterior oropharyngeal erythema.  Eyes:     Extraocular Movements: Extraocular movements intact.  Cardiovascular:     Rate and Rhythm: Normal rate and regular rhythm.     Pulses: Normal pulses.     Heart sounds: Normal heart sounds. No murmur heard. No friction rub. No gallop.   Pulmonary:     Effort: Pulmonary effort is normal. No tachypnea, bradypnea, accessory muscle usage or respiratory distress. She is not intubated.     Breath sounds: Normal breath sounds. No stridor. No decreased breath sounds, wheezing, rhonchi or rales.  Abdominal:     General: Abdomen is flat.     Tenderness:  There is no abdominal tenderness.  Musculoskeletal:        General: Normal range of motion.     Cervical back: Normal range of motion and neck supple. No tenderness.     Right lower leg: No tenderness. No edema.     Left lower leg: No tenderness. No edema.     Comments: No leg swelling or calf tenderness.  Skin:    General: Skin is warm and dry.  Neurological:     General: No focal deficit present.     Mental Status: She is alert and oriented to person, place, and time.  Psychiatric:        Mood and Affect: Mood normal.        Behavior: Behavior normal.    ED Results / Procedures / Treatments   Labs (all labs ordered are listed, but only abnormal results are displayed) Labs Reviewed  COMPREHENSIVE METABOLIC PANEL - Abnormal; Notable for the following components:      Result Value   Creatinine, Ser 1.04 (*)    Total Protein 8.7 (*)    All other components within normal limits  CBC WITH DIFFERENTIAL/PLATELET    EKG EKG Interpretation  Date/Time:  Monday May 21 2020 19:58:50 EST Ventricular Rate:  83 PR Interval:  186 QRS Duration: 82 QT Interval:  368 QTC Calculation: 432 R Axis:   81 Text Interpretation: Normal sinus rhythm Normal ECG No old tracing to compare Confirmed by Melene Plan [phone]) on 05/21/2020 8:22:09 PM  Radiology DG Chest 2 View  Result Date: 05/21/2020 CLINICAL DATA:  Shortness of breath x1 week. EXAM: CHEST - 2 VIEW COMPARISON:  Sep 25, 2017 FINDINGS: The heart size and mediastinal contours are within normal limits. Both lungs are clear. The visualized skeletal structures are unremarkable. IMPRESSION: No active cardiopulmonary disease. Electronically Signed   By: Aram Candela M.D.   On: 05/21/2020 15:22    Procedures Procedures (including critical care time)  Medications Ordered in ED Medications - No data to display  ED Course  I have reviewed the triage vital signs and the nursing notes.  Pertinent labs & imaging results that were  available during my care of the patient were reviewed by me and considered in my medical decision making (see chart for details).    MDM Rules/Calculators/A&P                          Patient presents today due to some mild intermittent shortness of breath.  Only notes shortness of breath with exertion and also notes that it is intermittent.  None currently.  Basic labs  obtained in triage and are reassuring.  CBC without leukocytosis.  Electrolytes within normal limits on CMP.  Chest x-ray negative.  Denies any URI symptoms or chest pain.  ECG normal sinus rhythm.  I had nursing staff ambulate the patient and she maintained saturations above 99% on room air with ambulation.  No signs of clinical hypoxia.  Besides age, patient is PERC negative.  Unsure of the cause of patient's symptoms.  Will discharge on an albuterol inhaler.  Recommended that she follow-up with her PCP regarding her symptoms if they persist.  We discussed return precautions in length.  Patient eager to be discharged.  Her questions were answered and she was amicable at the time of discharge.  Her vital signs are stable.  Final Clinical Impression(s) / ED Diagnoses Final diagnoses:  Dyspnea, unspecified type    Rx / DC Orders ED Discharge Orders         Ordered    albuterol (VENTOLIN HFA) 108 (90 Base) MCG/ACT inhaler  Every 6 hours PRN        05/21/20 2037           Rayna Sexton, PA-C 05/21/20 2038    Deno Etienne, DO 05/21/20 2208

## 2020-09-02 ENCOUNTER — Encounter (HOSPITAL_BASED_OUTPATIENT_CLINIC_OR_DEPARTMENT_OTHER): Payer: Self-pay | Admitting: *Deleted

## 2020-09-02 ENCOUNTER — Emergency Department (HOSPITAL_BASED_OUTPATIENT_CLINIC_OR_DEPARTMENT_OTHER)
Admission: EM | Admit: 2020-09-02 | Discharge: 2020-09-02 | Disposition: A | Discharge status: Home / Self Care | Payer: Medicaid Other | Attending: Emergency Medicine | Admitting: Emergency Medicine

## 2020-09-02 ENCOUNTER — Other Ambulatory Visit: Payer: Self-pay

## 2020-09-02 DIAGNOSIS — I10 Essential (primary) hypertension: Secondary | ICD-10-CM | POA: Insufficient documentation

## 2020-09-02 DIAGNOSIS — Z87891 Personal history of nicotine dependence: Secondary | ICD-10-CM | POA: Insufficient documentation

## 2020-09-02 DIAGNOSIS — L723 Sebaceous cyst: Secondary | ICD-10-CM | POA: Insufficient documentation

## 2020-09-02 NOTE — ED Triage Notes (Signed)
Reports cyst right axilla 1-2 weeks. States had one drained "years ago".

## 2020-09-02 NOTE — ED Notes (Signed)
AMA process explained and MSE waiver signed by patient. 

## 2020-09-02 NOTE — Discharge Instructions (Signed)
You came to the emerge department today to be evaluated for your swelling in the right armpit.  Patient your physical exam this appears to be a epidermoid cyst.  You will need to have a dermatologist or general surgeon remove this cyst.  Have given you information to follow-up with general surgery.  Get help right away if: Redness spreads from the cyst into the surrounding area.

## 2020-09-02 NOTE — ED Triage Notes (Signed)
Emergency Medicine Provider Triage Evaluation Note  Kimberly [name] , a 55 y.o. female  was evaluated in triage.  Pt complains of boil on her right axilla.  She notes she noticed proxy 1 weeks ago, that has increased in size become more painful.  She denies any drainage or discharge, denies fevers chills, has had in the past they are generally drained.  She is not immunocompromise, not up-to-date on her tetanus shot.  Review of Systems  Positive: Swelling underneath the axilla with pain. Negative: Denies headaches, fevers, chills  Physical Exam  BP (!) 143/90 (BP Location: Left Arm)   Pulse (!) 116   Temp 99.3 F (37.4 C) (Oral)   Resp 18   Ht 5\' 2"  (1.575 m)   SpO2 100%   BMI 25.42 kg/m  Gen:   Awake, no distress   HEENT:  Atraumatic  Resp:  Normal effort  Cardiac:  Normal rate  MSK:   Moves extremities without difficulty  Neuro:  Speech clear   Medical Decision Making  Medically screening exam initiated at 4:52 PM.  Appropriate orders placed.  was informed that the remainder of the evaluation will be completed by another provider, this initial triage assessment does not replace that evaluation, and the importance of remaining in the ED until their evaluation is complete.  Clinical Impression  Patient has boil underneath her right arm suspect it could be a abscess versus a cyst, patient will need further work-up here in the emergency department.   Renee Pain, PA-C 09/02/20 1653

## 2020-09-02 NOTE — ED Provider Notes (Signed)
[facility] EMERGENCY DEPARTMENT Provider Note   CSN: [id number] Arrival date & time: 09/02/20  1523     History No chief complaint on file.   Kimberly [name] is a 55 y.o. female presents with chief complaint of mass to her right axilla.  Patient reports she has noticed this over the last 1-1/2 weeks.  Patient reports slight increase in size over this time period.  Patient reports he only has pain when she is palpating the mass.  When she does patient rates her pain 3/10 on the pain scale.    Patient denies any discharge from the mass.  Patient denies any surrounding erythema, rash or pruritus.  Patient reports that she has had similar masses in the past and has seen dermatology before but it has been "multiple years."  Patient denies any fevers, chills, unexpected weight loss, mass or lumps to breast, changes to skin of breast, nipples, nipple discharge.  HPI     Past Medical History:  Diagnosis Date  . Hypertension     Patient Active Problem List   Diagnosis Date Noted  . Pharyngitis 04/01/2020  . Left ear pain 01/19/2020  . Allergic rhinitis 10/18/2019  . Nummular eczema 06/06/2019  . Borderline high blood pressure 06/06/2019    Past Surgical History:  Procedure Laterality Date  . CESAREAN SECTION    . TONSILLECTOMY    . TUBAL LIGATION       OB History   No obstetric history on file.     No family history on file.  Social History   Tobacco Use  . Smoking status: Former Games developer  . Smokeless tobacco: Never Used  Vaping Use  . Vaping Use: Never used  Substance Use Topics  . Alcohol use: Not Currently    Comment: occ  . Drug use: No    Home Medications Prior to Admission medications   Medication Sig Start Date End Date Taking? Authorizing Provider  albuterol (VENTOLIN HFA) 108 (90 Base) MCG/ACT inhaler Inhale 1-2 puffs into the lungs every 6 (six) hours as needed for wheezing or shortness of breath. 05/21/20   Placido Sou, PA-C  cetirizine HCl  (ZYRTEC) 1 MG/ML solution Take 5 mLs (5 mg total) by mouth daily. As needed for allergy symptoms 03/29/20   Simmons-Robinson, Tawanna Cooler, MD  fluticasone (FLONASE) 50 MCG/ACT nasal spray Place 2 sprays into both nostrils daily. 03/29/20   Simmons-Robinson, Makiera, MD  hydrocortisone cream 0.5 % Apply 1 application topically 2 (two) times daily. 01/18/20   Dana Allan, MD  magic mouthwash SOLN Take 5 mLs by mouth 3 (three) times daily as needed for mouth pain. 03/29/20   Simmons-Robinson, Makiera, MD  triamcinolone cream (KENALOG) 0.1 % APPLY 1 APPLICATION TO THE SKIN 2 TIMES DAILY 01/25/20   Dana Allan, MD    Allergies    Patient has no known allergies.  Review of Systems   Review of Systems  Constitutional: Negative for chills, fever and unexpected weight change.  Skin: Negative for color change, pallor, rash and wound.    Physical Exam Updated Vital Signs BP (!) 132/96 (BP Location: Right Arm)   Pulse 89   Temp 99.3 F (37.4 C) (Oral)   Resp 20   Ht 5\' 2"  (1.575 m)   SpO2 100%   BMI 25.42 kg/m   Physical Exam Vitals and nursing note reviewed. Chaperone present: Female RN present as .  Constitutional:      General: She is not in acute distress.    Appearance: She  is not ill-appearing, toxic-appearing or diaphoretic.  HENT:     Head: Normocephalic and atraumatic.  Eyes:     General: No scleral icterus.       Right eye: No discharge.        Left eye: No discharge.  Cardiovascular:     Rate and Rhythm: Normal rate.  Pulmonary:     Effort: Pulmonary effort is normal.     Breath sounds: No stridor.  Chest:  Breasts:     Tanner Score is 5.     Right: No swelling, bleeding, inverted nipple, mass, nipple discharge, skin change or tenderness.     Left: No swelling, bleeding, inverted nipple, mass, nipple discharge, skin change or tenderness.      Comments: 2 cm firm, mobile, nodule palpated in right axilla, no tenderness to palpation.  No erythema, rash, discharge, or  wound observed. Musculoskeletal:     Cervical back: Normal range of motion and neck supple.  Lymphadenopathy:     Cervical: No cervical adenopathy.  Skin:    General: Skin is warm and dry.     Coloration: Skin is not jaundiced or pale.     Findings: No erythema.  Neurological:     General: No focal deficit present.     Mental Status: She is alert.  Psychiatric:        Behavior: Behavior is cooperative.     ED Results / Procedures / Treatments   Labs (all labs ordered are listed, but only abnormal results are displayed) Labs Reviewed - No data to display  EKG None  Radiology No results found.  Procedures Procedures   Medications Ordered in ED Medications - No data to display  ED Course  I have reviewed the triage vital signs and the nursing notes.  Pertinent labs & imaging results that were available during my care of the patient were reviewed by me and considered in my medical decision making (see chart for details).    MDM Rules/Calculators/A&P                          Alert 55 year old female no acute distress, nontoxic-appearing.  Patient presents with chief complaint of mass to right axilla.  Patient first noticed mass 1.5 weeks prior.  Patient denies any fevers, chills, unexpected weight loss, breast mass, change to skin overlying breast, tenderness to breasts, inverted nipples, nipple discharge, drainage from right axilla mass.  Patient reports seeing dermatologist in the past for mass.  Patient reports previous mammograms however denies any in the last year.  On physical exam 2 cm firm, mobile, nodule palpated in right axilla.  No tenderness to palpation.  No erythema, rash, discharge, or wounds observed.  Masses likely an epidermal cyst.  Patient given information to follow-up with general surgery.  Patient she can follow-up with her previous dermatologist if preferred.  Patient advised to follow-up with primary care provider for routine mammogram.  Patient  given strict return precautions.  Patient expressed understanding of all instructions and is agreeable with plan.  Patient was discussed with and evaluated by Dr. Jacqulyn Bath.   Final Clinical Impression(s) / ED Diagnoses Final diagnoses:  Sebaceous cyst of right axilla    Rx / DC Orders ED Discharge Orders    None       Berneice Heinrich 09/03/20 8527    Maia Plan, MD 09/03/20 [phone]

## 2021-04-25 ENCOUNTER — Other Ambulatory Visit: Payer: Self-pay

## 2021-04-25 ENCOUNTER — Encounter (HOSPITAL_BASED_OUTPATIENT_CLINIC_OR_DEPARTMENT_OTHER): Payer: Self-pay | Admitting: *Deleted

## 2021-04-25 ENCOUNTER — Emergency Department (HOSPITAL_BASED_OUTPATIENT_CLINIC_OR_DEPARTMENT_OTHER)
Admission: EM | Admit: 2021-04-25 | Discharge: 2021-04-25 | Disposition: A | Discharge status: Home / Self Care | Payer: Medicaid Other | Attending: Emergency Medicine | Admitting: Emergency Medicine

## 2021-04-25 DIAGNOSIS — Z87891 Personal history of nicotine dependence: Secondary | ICD-10-CM | POA: Insufficient documentation

## 2021-04-25 DIAGNOSIS — Z79899 Other long term (current) drug therapy: Secondary | ICD-10-CM | POA: Insufficient documentation

## 2021-04-25 DIAGNOSIS — I10 Essential (primary) hypertension: Secondary | ICD-10-CM | POA: Insufficient documentation

## 2021-04-25 LAB — CBC WITH DIFFERENTIAL/PLATELET
Abs Immature Granulocytes: 0.02 10*3/uL (ref 0.00–0.07)
Basophils Absolute: 0 10*3/uL (ref 0.0–0.1)
Basophils Relative: 0 %
Eosinophils Absolute: 0.1 10*3/uL (ref 0.0–0.5)
Eosinophils Relative: 1 %
HCT: 36.3 % (ref 36.0–46.0)
Hemoglobin: 12 g/dL (ref 12.0–15.0)
Immature Granulocytes: 0 %
Lymphocytes Relative: 36 %
Lymphs Abs: 3.2 10*3/uL (ref 0.7–4.0)
MCH: 27.4 pg (ref 26.0–34.0)
MCHC: 33.1 g/dL (ref 30.0–36.0)
MCV: 82.9 fL (ref 80.0–100.0)
Monocytes Absolute: 0.5 10*3/uL (ref 0.1–1.0)
Monocytes Relative: 5 %
Neutro Abs: 5.2 10*3/uL (ref 1.7–7.7)
Neutrophils Relative %: 58 %
Platelets: 254 10*3/uL (ref 150–400)
RBC: 4.38 MIL/uL (ref 3.87–5.11)
RDW: 13.9 % (ref 11.5–15.5)
WBC: 9.1 10*3/uL (ref 4.0–10.5)
nRBC: 0 % (ref 0.0–0.2)

## 2021-04-25 LAB — COMPREHENSIVE METABOLIC PANEL
ALT: 20 U/L (ref 0–44)
AST: 26 U/L (ref 15–41)
Albumin: 4.5 g/dL (ref 3.5–5.0)
Alkaline Phosphatase: 87 U/L (ref 38–126)
Anion gap: 11 (ref 5–15)
BUN: 12 mg/dL (ref 6–20)
CO2: 24 mmol/L (ref 22–32)
Calcium: 9.5 mg/dL (ref 8.9–10.3)
Chloride: 103 mmol/L (ref 98–111)
Creatinine, Ser: 0.79 mg/dL (ref 0.44–1.00)
GFR, Estimated: >60 mL/min (ref >60)
Glucose, Bld: 91 mg/dL (ref 70–99)
Potassium: 4.1 mmol/L (ref 3.5–5.1)
Sodium: 138 mmol/L (ref 135–145)
Total Bilirubin: 0.8 mg/dL (ref 0.3–1.2)
Total Protein: 8.3 g/dL — ABNORMAL HIGH (ref 6.5–8.1)

## 2021-04-25 MED ORDER — ALBUTEROL SULFATE HFA 108 (90 BASE) MCG/ACT IN AERS
2.0000 | INHALATION_SPRAY | Freq: Four times a day (QID) | RESPIRATORY_TRACT | Status: DC | PRN
  Administered 2021-04-25: 2 via RESPIRATORY_TRACT
  Filled 2021-04-25: qty 6.7

## 2021-04-25 MED ORDER — ONDANSETRON HCL 4 MG/2ML IJ SOLN
INTRAMUSCULAR | Status: AC
  Administered 2021-04-25: 4 mg
  Filled 2021-04-25: qty 2

## 2021-04-25 NOTE — ED Provider Notes (Signed)
[facility] EMERGENCY DEPARTMENT Provider Note   CSN: [id number] Arrival date & time: 04/25/21  1751     History Chief Complaint  Patient presents with   Hypertension   Headache    Kimberly [name] is a 55 y.o. female.  Patient is a 55 year old female who presents with elevated blood pressures.  She has a history of hypertension.  She is on losartan.  She also has a prescription for amlodipine but she just saw the prescription in her medication bag and has not been taking it.  She notes that her diastolic blood pressures been around 90 and she was concerned about that.  She had a little bit of a headache earlier today but denies any current headache.  No chest pain or shortness of breath.  She otherwise feels fine.  She denies any numbness or weakness in her extremities.  No difficulty with her balance.      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     No family history on file.  Social History   Tobacco Use   Smoking status: Former   Smokeless tobacco: Never  Scientific laboratory technician Use: Never used  Substance Use Topics   Alcohol use: Not Currently    Comment: occ   Drug use: No    Home Medications Prior to Admission medications   Medication Sig Start Date End Date Taking? Authorizing Provider  LOSARTAN POTASSIUM PO Take 25 mg by mouth daily.   Yes [provider]  albuterol (VENTOLIN HFA) 108 (90 Base) MCG/ACT inhaler Inhale 1-2 puffs into the lungs every 6 (six) hours as needed for wheezing or shortness of breath. 05/21/20   Rayna Sexton, PA-C  cetirizine HCl  (ZYRTEC) 1 MG/ML solution Take 5 mLs (5 mg total) by mouth daily. As needed for allergy symptoms 03/29/20   Simmons-Robinson, Riki Sheer, MD  fluticasone (FLONASE) 50 MCG/ACT nasal spray Place 2 sprays into both nostrils daily. 03/29/20   Simmons-Robinson, Makiera, MD  hydrocortisone cream 0.5 % Apply 1 application topically 2 (two) times daily. 01/18/20   Carollee Leitz, MD  magic mouthwash SOLN Take 5 mLs by mouth 3 (three) times daily as needed for mouth pain. 03/29/20   Simmons-Robinson, Makiera, MD  triamcinolone cream (KENALOG) 0.1 % APPLY 1 APPLICATION TO THE SKIN 2 TIMES DAILY 01/25/20   Carollee Leitz, MD    Allergies    Patient has no known allergies.  Review of Systems   Review of Systems  Constitutional:  Negative for chills, diaphoresis, fatigue and fever.  HENT:  Negative for congestion, rhinorrhea and sneezing.   Eyes: Negative.   Respiratory:  Negative for cough, chest tightness and shortness of breath.   Cardiovascular:  Negative for chest pain and leg swelling.  Gastrointestinal:  Negative for abdominal pain, blood in stool, diarrhea, nausea and vomiting.  Genitourinary:  Negative for difficulty urinating, flank pain, frequency and hematuria.  Musculoskeletal:  Negative for arthralgias and back pain.  Skin:  Negative for rash.  Neurological:  Positive for headaches.  Negative for dizziness, speech difficulty, weakness and numbness.   Physical Exam Updated Vital Signs BP (!) 146/86 (BP Location: Left Arm)   Pulse 85   Temp 98.2 F (36.8 C) (Oral)   Resp (!) 22   Ht 5\' 2"  (1.575 m)   Wt 63 kg   SpO2 100%   BMI 25.40 kg/m   Physical Exam Constitutional:      Appearance: She is well-developed.  HENT:     Head: Normocephalic and atraumatic.  Eyes:     Pupils: Pupils are equal, round, and reactive to light.  Cardiovascular:     Rate and Rhythm: Normal rate and regular rhythm.     Heart sounds: Normal heart sounds.  Pulmonary:     Effort: Pulmonary effort is normal. No  respiratory distress.     Breath sounds: Normal breath sounds. No wheezing or rales.  Chest:     Chest wall: No tenderness.  Abdominal:     General: Bowel sounds are normal.     Palpations: Abdomen is soft.     Tenderness: There is no abdominal tenderness. There is no guarding or rebound.  Musculoskeletal:        General: Normal range of motion.     Cervical back: Normal range of motion and neck supple.  Lymphadenopathy:     Cervical: No cervical adenopathy.  Skin:    General: Skin is warm and dry.     Findings: No rash.  Neurological:     General: No focal deficit present.     Mental Status: She is alert and oriented to person, place, and time.    ED Results / Procedures / Treatments   Labs (all labs ordered are listed, but only abnormal results are displayed) Labs Reviewed  COMPREHENSIVE METABOLIC PANEL - Abnormal; Notable for the following components:      Result Value   Total Protein 8.3 (*)    All other components within normal limits  CBC WITH DIFFERENTIAL/PLATELET    EKG EKG Interpretation  Date/Time:  Thursday April 25 2021 21:47:18 EST Ventricular Rate:  88 PR Interval:  174 QRS Duration: 82 QT Interval:  365 QTC Calculation: 442 R Axis:   69 Text Interpretation: Sinus rhythm since last tracing no significant change Confirmed by Malvin Johns [phone]) on 04/25/2021 9:59:34 PM  Radiology No results found.  Procedures Procedures   Medications Ordered in ED Medications  albuterol (VENTOLIN HFA) 108 (90 Base) MCG/ACT inhaler 2 puff (has no administration in time range)  ondansetron (ZOFRAN) 4 MG/2ML injection (4 mg  Given 04/25/21 2122)    ED Course  I have reviewed the triage vital signs and the nursing notes.  Pertinent labs & imaging results that were available during my care of the patient were reviewed by me and considered in my medical decision making (see chart for details).    MDM Rules/Calculators/A&P                           Patient  is a 55 year old female who presents with concerns of an elevated blood pressure.  She is currently asymptomatic.  Her blood pressure has improved while she is in the ED.  It initially was 162/89.  Her most recent blood pressure is 134/86.  She was discharged home in good condition.  She will contact her primary care doctor about whether she is supposed to be taking both the losartan and and the amlodipine or just the losartan.  Its  unclear at this point.  She has a follow-up appointment on December 19 with her PCP.  Return precautions were given. Final Clinical Impression(s) / ED Diagnoses Final diagnoses:  Hypertension, unspecified type    Rx / DC Orders ED Discharge Orders     None        Rolan Bucco, MD 04/25/21 2220

## 2021-04-25 NOTE — ED Triage Notes (Signed)
Headache. She is treated for HTN.

## 2021-05-08 IMAGING — CR DG CHEST 2V
2 series · 2 of 2 positions shown · non-contrast
Comparison: September 25, 2017

CLINICAL DATA: Shortness of breath x1 week.

EXAM:
CHEST - 2 VIEW

[w chest pa]
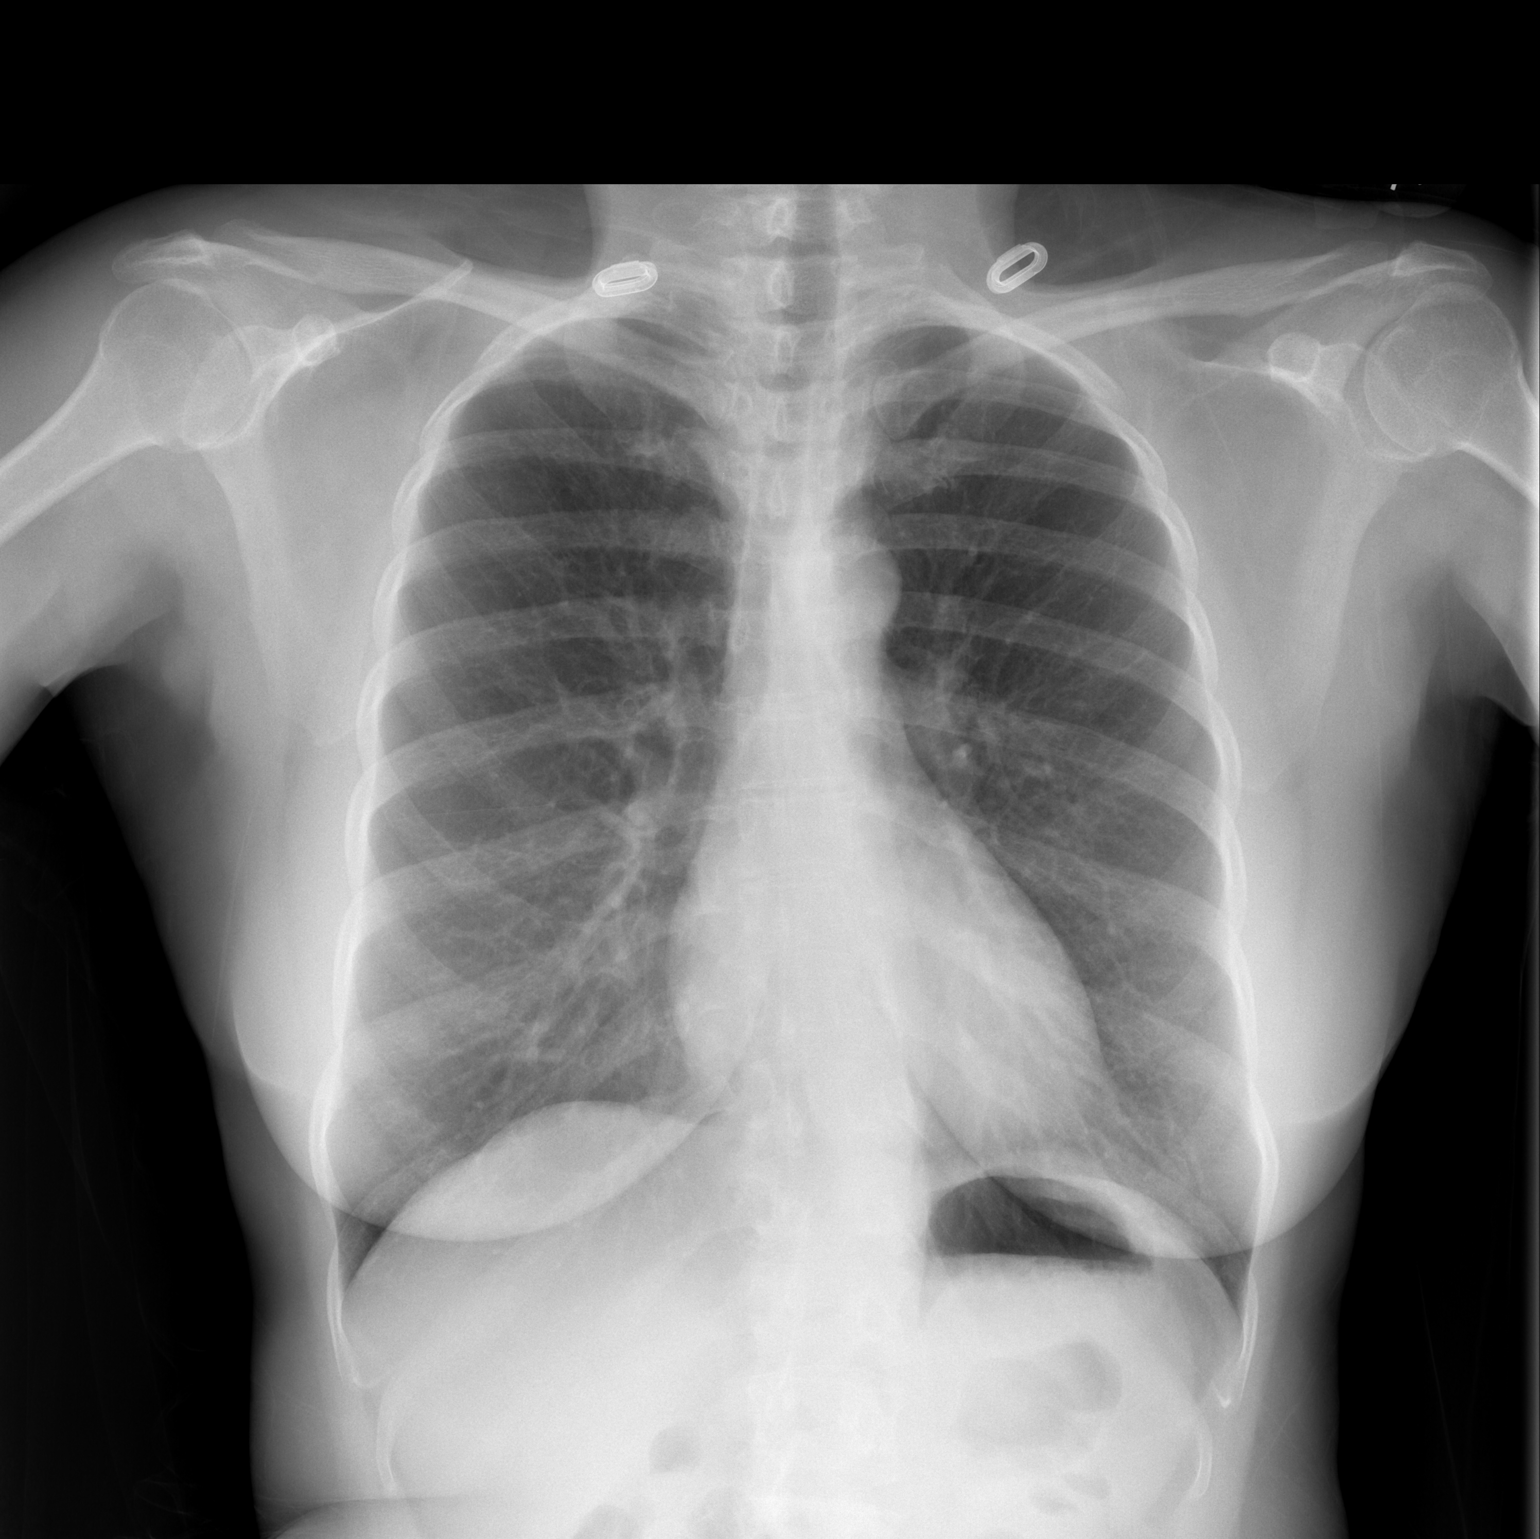

[w chest lat]
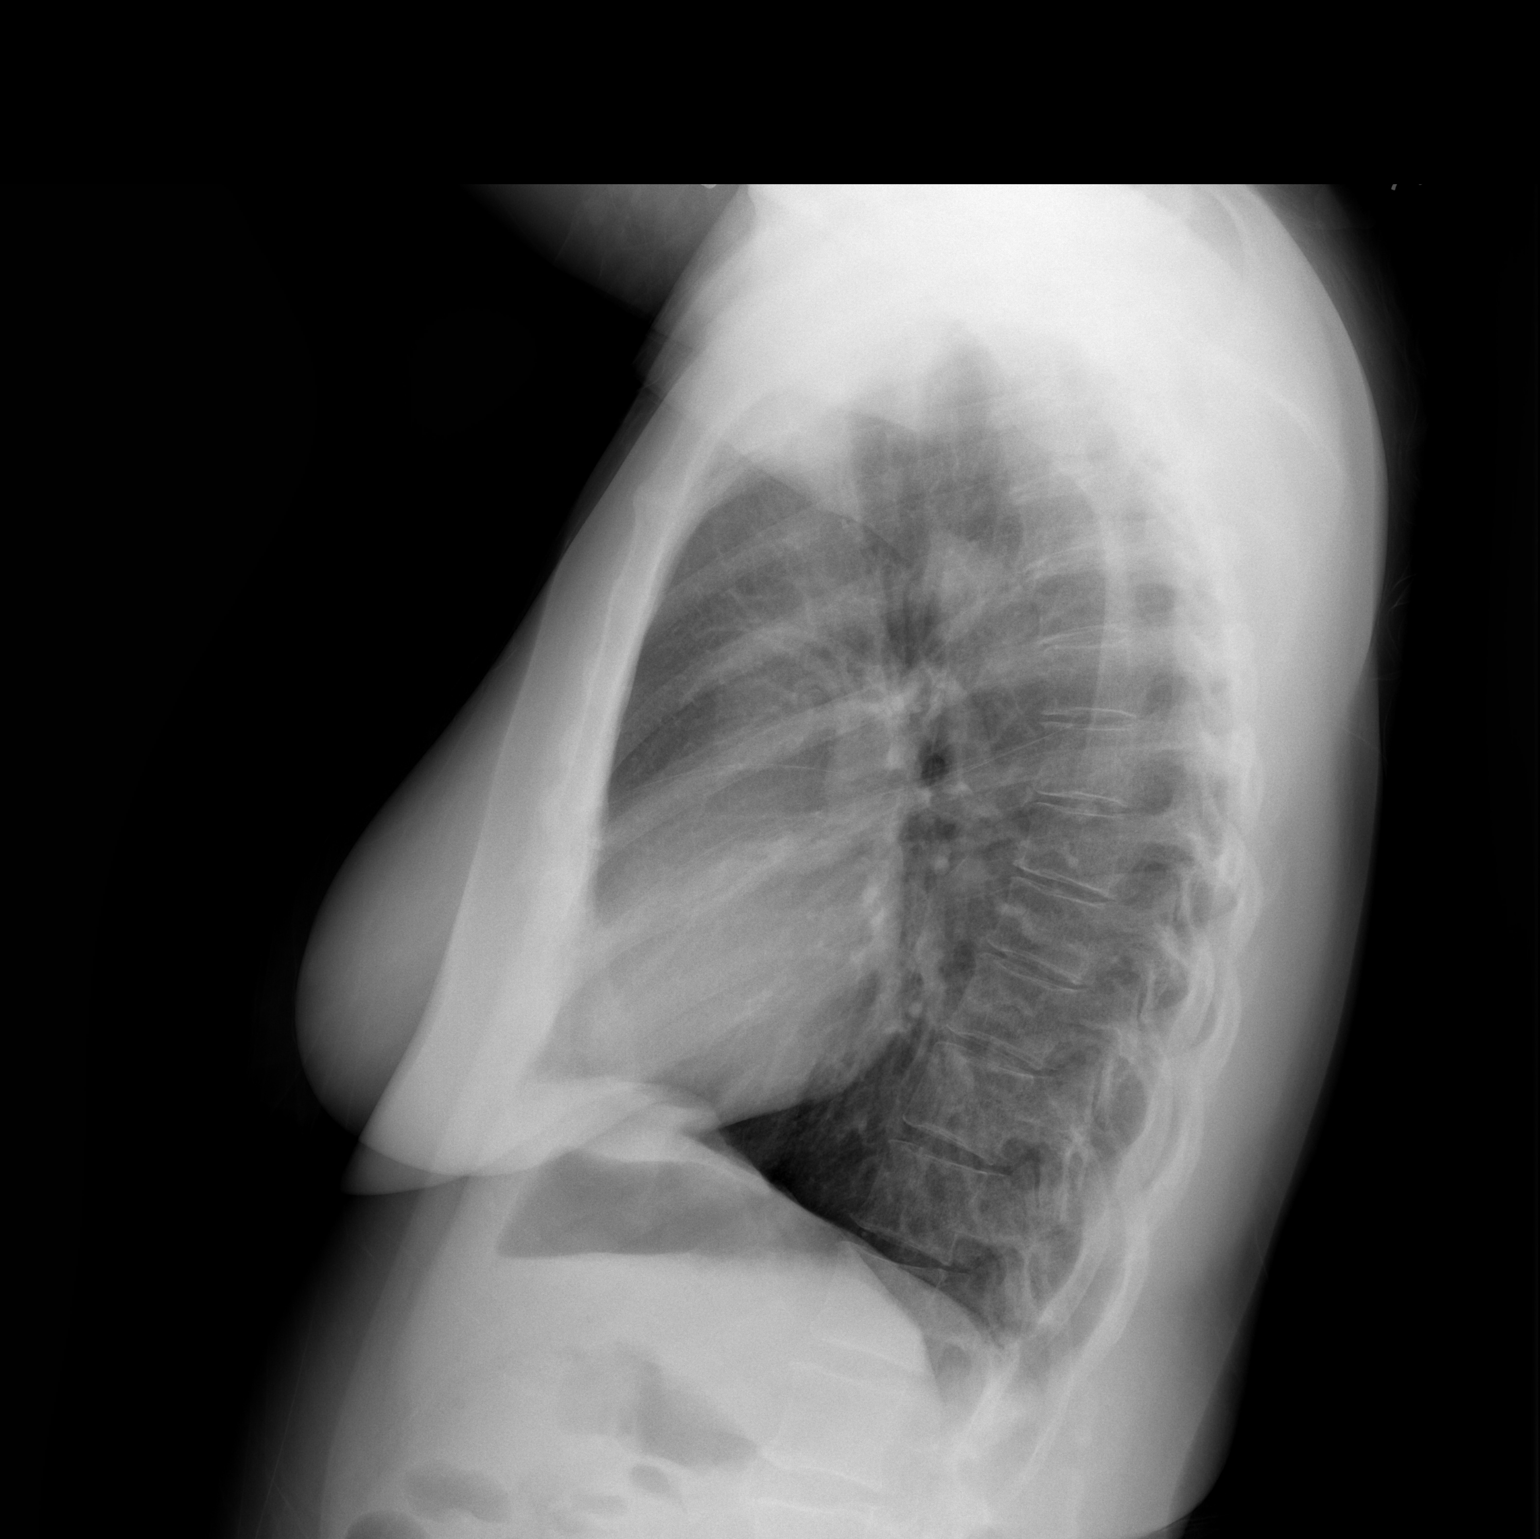

[2 of 2 positions shown; findings below may reference images not displayed]

FINDINGS: The heart size and mediastinal contours are within normal limits.
Both lungs are clear. The visualized skeletal structures are
unremarkable.
IMPRESSION: No active cardiopulmonary disease.

## 2022-06-23 ENCOUNTER — Other Ambulatory Visit (HOSPITAL_COMMUNITY): Payer: Self-pay

## 2022-07-22 ENCOUNTER — Ambulatory Visit (INDEPENDENT_AMBULATORY_CARE_PROVIDER_SITE_OTHER): Payer: Medicaid Other | Admitting: Family Medicine

## 2022-07-22 ENCOUNTER — Encounter: Payer: Self-pay | Admitting: Family Medicine

## 2022-07-22 VITALS — BP 120/76 | HR 92 | Temp 97.3°F | Ht 62.0 in | Wt 139.6 lb

## 2022-07-22 DIAGNOSIS — R7309 Other abnormal glucose: Secondary | ICD-10-CM

## 2022-07-22 DIAGNOSIS — Z0000 Encounter for general adult medical examination without abnormal findings: Secondary | ICD-10-CM | POA: Insufficient documentation

## 2022-07-22 DIAGNOSIS — D509 Iron deficiency anemia, unspecified: Secondary | ICD-10-CM

## 2022-07-22 DIAGNOSIS — I10 Essential (primary) hypertension: Secondary | ICD-10-CM

## 2022-07-22 HISTORY — DX: Essential (primary) hypertension: I10

## 2022-07-22 HISTORY — DX: Iron deficiency anemia, unspecified: D50.9

## 2022-07-22 HISTORY — DX: Encounter for general adult medical examination without abnormal findings: Z00.00

## 2022-07-22 HISTORY — DX: Other abnormal glucose: R73.09

## 2022-07-22 NOTE — Progress Notes (Signed)
Established Patient Office Visit   Subjective:  Patient ID: Kimberly [name], female    DOB: 1965/06/20  Age: 57 y.o. MRN: [id number]  Chief Complaint  Patient presents with   Establish Care    NP/establish care discuss BP and labs iron levels little low.     HPI Encounter Diagnoses  Name Primary?   Essential hypertension Yes   Iron deficiency anemia, unspecified iron deficiency anemia type    Healthcare maintenance    Elevated glucose    For follow-up of the above.  Blood pressure has been hereto for well-controlled with the losartan 25 mg.  She has been able to maintain her weight.  She is mindful of salt.  She does not drink alcohol or use illicit drugs.  She is up-to-date on health maintenance including Pap and pelvic, colonoscopy.  She feels well.  Hypertension runs on mom side of the family and diabetes on dad's.   Review of Systems  Constitutional: Negative.   HENT: Negative.    Eyes:  Negative for blurred vision, discharge and redness.  Respiratory: Negative.    Cardiovascular: Negative.   Gastrointestinal:  Negative for abdominal pain.  Genitourinary: Negative.   Musculoskeletal: Negative.  Negative for myalgias.  Skin:  Negative for rash.  Neurological:  Negative for tingling, loss of consciousness and weakness.  Endo/Heme/Allergies:  Negative for polydipsia.     Current Outpatient Medications:    cyclobenzaprine (FLEXERIL) 10 MG tablet, PLEASE SEE ATTACHED FOR DETAILED DIRECTIONS, Disp: , Rfl:    ferrous sulfate (FER-IN-SOL) 75 (15 Fe) MG/ML SOLN, Take by mouth. Taking iron gummies, Disp: , Rfl:    LOSARTAN POTASSIUM PO, Take 25 mg by mouth daily., Disp: , Rfl:    Objective:     BP 120/76 (BP Location: Right Arm, Patient Position: Sitting, Cuff Size: Normal)   Pulse 92   Temp (!) 97.3 F (36.3 C) (Temporal)   Ht '5\' 2"'$  (1.575 m)   Wt 139 lb 9.6 oz (63.3 kg)   SpO2 98%   BMI 25.53 kg/m    Physical Exam Constitutional:      General: She is not in acute  distress.    Appearance: Normal appearance. She is not ill-appearing, toxic-appearing or diaphoretic.  HENT:     Head: Normocephalic and atraumatic.     Right Ear: External ear normal.     Left Ear: External ear normal.     Mouth/Throat:     Mouth: Mucous membranes are moist.     Pharynx: Oropharynx is clear. No oropharyngeal exudate or posterior oropharyngeal erythema.  Eyes:     General: No scleral icterus.       Right eye: No discharge.        Left eye: No discharge.     Extraocular Movements: Extraocular movements intact.     Conjunctiva/sclera: Conjunctivae normal.  Cardiovascular:     Rate and Rhythm: Normal rate and regular rhythm.  Pulmonary:     Effort: Pulmonary effort is normal. No respiratory distress.     Breath sounds: Normal breath sounds. No wheezing or rales.  Musculoskeletal:     Cervical back: No rigidity.  Lymphadenopathy:     Cervical: No cervical adenopathy.  Skin:    General: Skin is warm and dry.  Neurological:     Mental Status: She is alert and oriented to person, place, and time.  Psychiatric:        Mood and Affect: Mood normal.        Behavior: Behavior normal.  No results found for any visits on 07/22/22.    The ASCVD Risk score (Arnett DK, et al., 2019) failed to calculate for the following reasons:   Cannot find a previous HDL lab   Cannot find a previous total cholesterol lab    Assessment & Plan:   Essential hypertension -     CBC; Future -     Comprehensive metabolic panel; Future -     Urinalysis, Routine w reflex microscopic; Future  Iron deficiency anemia, unspecified iron deficiency anemia type -     CBC; Future -     Iron, TIBC and Ferritin Panel; Future  Healthcare maintenance -     Lipid panel; Future  Elevated glucose -     Hemoglobin A1c; Future    Return Requested follow-up time pends results of fasting labs.  Will return fasting for labs.Libby Maw, MD

## 2022-07-29 ENCOUNTER — Other Ambulatory Visit (INDEPENDENT_AMBULATORY_CARE_PROVIDER_SITE_OTHER): Payer: Medicaid Other

## 2022-07-29 DIAGNOSIS — D509 Iron deficiency anemia, unspecified: Secondary | ICD-10-CM | POA: Diagnosis not present

## 2022-07-29 DIAGNOSIS — Z0000 Encounter for general adult medical examination without abnormal findings: Secondary | ICD-10-CM | POA: Diagnosis not present

## 2022-07-29 DIAGNOSIS — R7309 Other abnormal glucose: Secondary | ICD-10-CM

## 2022-07-29 DIAGNOSIS — I10 Essential (primary) hypertension: Secondary | ICD-10-CM

## 2022-07-29 LAB — URINALYSIS, ROUTINE W REFLEX MICROSCOPIC
Bilirubin Urine: NEGATIVE
Hgb urine dipstick: NEGATIVE
Ketones, ur: NEGATIVE
Leukocytes,Ua: NEGATIVE
Nitrite: NEGATIVE
RBC / HPF: NONE SEEN (ref 0–?)
Specific Gravity, Urine: >=1.03 — AB (ref 1.000–1.030)
Total Protein, Urine: NEGATIVE
Urine Glucose: NEGATIVE
Urobilinogen, UA: 0.2 (ref 0.0–1.0)
pH: 5.5 (ref 5.0–8.0)

## 2022-07-29 LAB — COMPREHENSIVE METABOLIC PANEL
ALT: 12 U/L (ref 0–35)
AST: 17 U/L (ref 0–37)
Albumin: 4.3 g/dL (ref 3.5–5.2)
Alkaline Phosphatase: 88 U/L (ref 39–117)
BUN: 13 mg/dL (ref 6–23)
CO2: 28 mEq/L (ref 19–32)
Calcium: 9.6 mg/dL (ref 8.4–10.5)
Chloride: 105 mEq/L (ref 96–112)
Creatinine, Ser: 0.9 mg/dL (ref 0.40–1.20)
GFR: 71.52 mL/min (ref >60.00)
Glucose, Bld: 81 mg/dL (ref 70–99)
Potassium: 3.9 mEq/L (ref 3.5–5.1)
Sodium: 141 mEq/L (ref 135–145)
Total Bilirubin: 0.7 mg/dL (ref 0.2–1.2)
Total Protein: 7 g/dL (ref 6.0–8.3)

## 2022-07-29 LAB — HEMOGLOBIN A1C: Hgb A1c MFr Bld: 6.1 % (ref 4.6–6.5)

## 2022-07-29 LAB — CBC
HCT: 35.8 % — ABNORMAL LOW (ref 36.0–46.0)
Hemoglobin: 11.6 g/dL — ABNORMAL LOW (ref 12.0–15.0)
MCHC: 32.5 g/dL (ref 30.0–36.0)
MCV: 84.1 fl (ref 78.0–100.0)
Platelets: 236 10*3/uL (ref 150.0–400.0)
RBC: 4.26 Mil/uL (ref 3.87–5.11)
RDW: 13.9 % (ref 11.5–15.5)
WBC: 6.9 10*3/uL (ref 4.0–10.5)

## 2022-07-29 LAB — LIPID PANEL
Cholesterol: 197 mg/dL (ref 0–200)
HDL: 66.3 mg/dL (ref >39.00)
LDL Cholesterol: 112 mg/dL — ABNORMAL HIGH (ref 0–99)
NonHDL: 131.06
Total CHOL/HDL Ratio: 3
Triglycerides: 96 mg/dL (ref 0.0–149.0)
VLDL: 19.2 mg/dL (ref 0.0–40.0)

## 2022-07-29 NOTE — Progress Notes (Signed)
Pt is here for labs   

## 2022-07-30 LAB — IRON,TIBC AND FERRITIN PANEL
%SAT: 42 % (calc) (ref 16–45)
Ferritin: 39 ng/mL (ref 16–232)
Iron: 143 ug/dL (ref 45–160)
TIBC: 343 mcg/dL (calc) (ref 250–450)

## 2022-08-06 ENCOUNTER — Telehealth: Payer: Self-pay | Admitting: Family Medicine

## 2022-08-06 NOTE — Telephone Encounter (Signed)
Pt returned your call about her labs.

## 2022-08-07 NOTE — Telephone Encounter (Signed)
Called patient went over labs and recommendations follow up appointment scheduled.

## 2022-09-25 ENCOUNTER — Telehealth: Payer: Self-pay | Admitting: Family Medicine

## 2022-09-25 ENCOUNTER — Other Ambulatory Visit: Payer: Self-pay

## 2022-09-25 DIAGNOSIS — I10 Essential (primary) hypertension: Secondary | ICD-10-CM

## 2022-09-25 NOTE — Telephone Encounter (Signed)
Prescription Request  09/25/2022  LOV: 07/22/2022  What is the name of the medication or equipment? follow-up with me in 3   Have you contacted your pharmacy to request a refill? Yes  Out for 1 week - was last filled by urgent care prior to new pt visit with Dr. Doreene Burke  Which pharmacy would you like this sent to?  [id number] - [place], [place] - [address] [place] [address] Phone: [phone] Fax: [phone]    Patient notified that their request is being sent to the clinical staff for review and that they should receive a response within 2 business days.   Please advise at Mobile [phone] (mobile)

## 2022-09-26 MED ORDER — LOSARTAN POTASSIUM 25 MG PO TABS: 25.0000 mg | ORAL_TABLET | Freq: Every day | ORAL | 1 refills | Status: DC

## 2022-09-26 NOTE — Telephone Encounter (Signed)
Kimberly [name] [phone]   Pt is asking about her lLOSARTAN POTASSIUM PO [[id number]] she has been without for a week. Please call when sent in.

## 2022-09-26 NOTE — Telephone Encounter (Signed)
Refill request for Losartan potassium pharmacy does not have the combo prescription. Please advise.

## 2022-10-22 ENCOUNTER — Encounter: Payer: Self-pay | Admitting: Family Medicine

## 2022-10-22 ENCOUNTER — Ambulatory Visit (INDEPENDENT_AMBULATORY_CARE_PROVIDER_SITE_OTHER): Payer: Medicaid Other | Admitting: Family Medicine

## 2022-10-22 VITALS — BP 126/80 | HR 97 | Temp 98.4°F | Ht 62.0 in | Wt 135.2 lb

## 2022-10-22 DIAGNOSIS — I10 Essential (primary) hypertension: Secondary | ICD-10-CM | POA: Diagnosis not present

## 2022-10-22 DIAGNOSIS — D509 Iron deficiency anemia, unspecified: Secondary | ICD-10-CM

## 2022-10-22 DIAGNOSIS — R7303 Prediabetes: Secondary | ICD-10-CM

## 2022-10-22 HISTORY — DX: Prediabetes: R73.03

## 2022-10-22 MED ORDER — LOSARTAN POTASSIUM 25 MG PO TABS: 25.0000 mg | ORAL_TABLET | Freq: Every day | ORAL | 3 refills | Status: DC

## 2022-10-22 MED ORDER — METFORMIN HCL ER 500 MG PO TB24: 500.0000 mg | ORAL_TABLET | Freq: Every evening | ORAL | 1 refills | Status: DC

## 2022-10-22 NOTE — Progress Notes (Signed)
Established Patient Office Visit   Subjective:  Patient ID: Kimberly Kimberly [name], female    DOB: August 02, 1965  Age: 57 y.o. MRN: [id number]  Chief Complaint  Patient presents with   Medical Management of Chronic Issues    3 month follow up, no concerns. Patient fasting.     HPI Encounter Diagnoses  Name Primary?   Prediabetes Yes   Essential hypertension    For follow-up of above.  Blood pressure is well-controlled with telmisartan.  Tolerating medication well.  Continues iron Gummies every other day.  A1c elevated to 6.1 at last check.  No history of diabetes or gestational diabetes.  Her father did have diabetes, was on dialysis and passed from a staph infection.  Patient is otherwise healthy active and has a positive attitude.   Review of Systems  Constitutional: Negative.   HENT: Negative.    Eyes:  Negative for blurred vision, discharge and redness.  Respiratory: Negative.    Cardiovascular: Negative.   Gastrointestinal:  Negative for abdominal Kimberly [name].  Genitourinary: Negative.   Musculoskeletal: Negative.  Negative for myalgias.  Skin:  Negative for rash.  Neurological:  Negative for tingling, loss of consciousness and weakness.  Endo/Heme/Allergies:  Negative for polydipsia.     Current Outpatient Medications:    cyclobenzaprine (FLEXERIL) 10 MG tablet, PLEASE SEE ATTACHED FOR DETAILED DIRECTIONS, Disp: , Rfl:    ferrous sulfate (FER-IN-SOL) 75 (15 Fe) MG/ML SOLN, Take by mouth. Taking iron gummies, Disp: , Rfl:    metFORMIN (GLUCOPHAGE-XR) 500 MG 24 hr tablet, Take 1 tablet (500 mg total) by mouth at bedtime., Disp: 90 tablet, Rfl: 1   losartan (COZAAR) 25 MG tablet, Take 1 tablet (25 mg total) by mouth daily., Disp: 90 tablet, Rfl: 3   Objective:     BP 126/80 (BP Location: Right Arm, Patient Position: Sitting, Cuff Size: Normal)   Pulse 97   Temp 98.4 F (36.9 C) (Temporal)   Ht 5\' 2"  (1.575 m)   Wt 135 lb 3.2 oz (61.3 kg)   SpO2 99%   BMI 24.73 kg/m  BP Readings  from Last 3 Encounters:  10/22/22 126/80  07/22/22 120/76  04/25/21 132/83   Wt Readings from Last 3 Encounters:  10/22/22 135 lb 3.2 oz (61.3 kg)  07/22/22 139 lb 9.6 oz (63.3 kg)  04/25/21 138 lb 14.2 oz (63 kg)      Physical Exam Constitutional:      General: She is not in acute distress.    Appearance: Normal appearance. She is not ill-appearing, toxic-appearing or diaphoretic.  HENT:     Head: Normocephalic and atraumatic.     Right Ear: External ear normal.     Left Ear: External ear normal.  Eyes:     General: No scleral icterus.       Right eye: No discharge.        Left eye: No discharge.     Extraocular Movements: Extraocular movements intact.     Conjunctiva/sclera: Conjunctivae normal.  Pulmonary:     Effort: Pulmonary effort is normal. No respiratory distress.  Skin:    General: Skin is warm and dry.  Neurological:     Mental Status: She is alert and oriented to person, place, and time.  Psychiatric:        Mood and Affect: Mood normal.        Behavior: Behavior normal.      No results found for any visits on 10/22/22.    The 10-year ASCVD risk score (  Arnett DK, et al., 2019) is: 4.2%    Assessment & Plan:   Prediabetes -     Losartan Potassium; Take 1 tablet (25 mg total) by mouth daily.  Dispense: 90 tablet; Refill: 3 -     metFORMIN HCl ER; Take 1 tablet (500 mg total) by mouth at bedtime.  Dispense: 90 tablet; Refill: 1  Essential hypertension -     Losartan Potassium; Take 1 tablet (25 mg total) by mouth daily.  Dispense: 90 tablet; Refill: 3    Return in about 6 months (around 04/23/2023), or if symptoms worsen or fail to improve.  Continue losartan and iron.  Discussed starting metformin for prevention of the onset of diabetes.  Discussed side effects of abdominal cramping with loose stools that typically pass for most patients.  She agrees to start the medication.  Me know if there are any issues past 2 to 3 weeks and follow-up for  recheck.  She will continue exercising.  Information was given on prediabetes.  She will avoid all sugary drinks.  Mliss Sax, MD

## 2022-11-05 ENCOUNTER — Ambulatory Visit: Payer: Medicaid Other | Admitting: Family Medicine

## 2023-04-23 ENCOUNTER — Ambulatory Visit: Payer: Medicaid Other | Admitting: Family Medicine

## 2023-04-23 ENCOUNTER — Encounter: Payer: Self-pay | Admitting: Family Medicine

## 2023-04-23 VITALS — BP 144/84 | HR 91 | Temp 97.6°F | Ht 62.0 in | Wt 119.6 lb

## 2023-04-23 DIAGNOSIS — R7303 Prediabetes: Secondary | ICD-10-CM | POA: Diagnosis not present

## 2023-04-23 DIAGNOSIS — I10 Essential (primary) hypertension: Secondary | ICD-10-CM

## 2023-04-23 DIAGNOSIS — Z638 Other specified problems related to primary support group: Secondary | ICD-10-CM

## 2023-04-23 LAB — BASIC METABOLIC PANEL
BUN: 13 mg/dL (ref 6–23)
CO2: 27 meq/L (ref 19–32)
Calcium: 9.3 mg/dL (ref 8.4–10.5)
Chloride: 105 meq/L (ref 96–112)
Creatinine, Ser: 0.77 mg/dL (ref 0.40–1.20)
GFR: 85.8 mL/min (ref >60.00)
Glucose, Bld: 82 mg/dL (ref 70–99)
Potassium: 4.2 meq/L (ref 3.5–5.1)
Sodium: 139 meq/L (ref 135–145)

## 2023-04-23 LAB — HEMOGLOBIN A1C: Hgb A1c MFr Bld: 5.9 % (ref 4.6–6.5)

## 2023-04-23 MED ORDER — METFORMIN HCL ER 500 MG PO TB24: 500.0000 mg | ORAL_TABLET | Freq: Every evening | ORAL | 1 refills | Status: DC

## 2023-04-23 MED ORDER — LOSARTAN POTASSIUM 25 MG PO TABS: 25.0000 mg | ORAL_TABLET | Freq: Every day | ORAL | 3 refills | Status: DC

## 2023-04-23 NOTE — Progress Notes (Addendum)
Established Patient Office Visit   Subjective:  Patient ID: Kimberly [name], female    DOB: 08-Mar-1966  Age: 57 y.o. MRN: [id number]  Chief Complaint  Patient presents with   Medical Management of Chronic Issues    6 month follow up. Pt is fasting. Pt states she only took Metformin for 2 months and then stopped.     HPI Encounter Diagnoses  Name Primary?   Stress due to family tension Yes   Essential hypertension    Prediabetes    For follow-up of hypertension and prediabetes.  Intentional weight loss of 16 pounds.  She is active physically.  With her weight loss she decided to discontinue the Glucophage.  There is been stress at her house with her 2 year old son.  She is ready for him to spread out his wings but apparently he is not.  He does have a job at [employer].  She continues taking the losartan.  Admits increased sodium in her diet blood pressure has been well-controlled with the lower dose.   Review of Systems  Constitutional: Negative.   HENT: Negative.    Eyes:  Negative for blurred vision, discharge and redness.  Respiratory: Negative.    Cardiovascular: Negative.   Gastrointestinal:  Negative for abdominal [name].  Genitourinary: Negative.   Musculoskeletal: Negative.  Negative for myalgias.  Skin:  Negative for rash.  Neurological:  Negative for tingling, loss of consciousness and weakness.  Endo/Heme/Allergies:  Negative for polydipsia.  Psychiatric/Behavioral:  The patient is nervous/anxious.      Current Outpatient Medications:    cyclobenzaprine (FLEXERIL) 10 MG tablet, PLEASE SEE ATTACHED FOR DETAILED DIRECTIONS, Disp: , Rfl:    ferrous sulfate (FER-IN-SOL) 75 (15 Fe) MG/ML SOLN, Take by mouth. Taking iron gummies, Disp: , Rfl:    losartan (COZAAR) 25 MG tablet, Take 1 tablet (25 mg total) by mouth daily., Disp: 90 tablet, Rfl: 3   metFORMIN (GLUCOPHAGE-XR) 500 MG 24 hr tablet, Take 1 tablet (500 mg total) by mouth at bedtime., Disp: 90 tablet, Rfl: 1    Objective:     BP (!) 144/84   Pulse 91   Temp 97.6 F (36.4 C)   Ht 5\' 2"  (1.575 m)   Wt 119 lb 9.6 oz (54.3 kg)   SpO2 99%   BMI 21.88 kg/m  BP Readings from Last 3 Encounters:  04/23/23 (!) 144/84  10/22/22 126/80  07/22/22 120/76   Wt Readings from Last 3 Encounters:  04/23/23 119 lb 9.6 oz (54.3 kg)  10/22/22 135 lb 3.2 oz (61.3 kg)  07/22/22 139 lb 9.6 oz (63.3 kg)      Physical Exam Constitutional:      General: She is not in acute distress.    Appearance: Normal appearance. She is not ill-appearing, toxic-appearing or diaphoretic.  HENT:     Head: Normocephalic and atraumatic.     Right Ear: External ear normal.     Left Ear: External ear normal.  Eyes:     General: No scleral icterus.       Right eye: No discharge.        Left eye: No discharge.     Extraocular Movements: Extraocular movements intact.     Conjunctiva/sclera: Conjunctivae normal.  Pulmonary:     Effort: Pulmonary effort is normal. No respiratory distress.  Skin:    General: Skin is warm and dry.  Neurological:     Mental Status: She is alert and oriented to person, place, and time.  Psychiatric:  Mood and Affect: Mood normal.        Behavior: Behavior normal.      Results for orders placed or performed in visit on 04/23/23  Hemoglobin A1c  Result Value Ref Range   Hgb A1c MFr Bld 5.9 4.6 - 6.5 %  Basic metabolic panel  Result Value Ref Range   Sodium 139 135 - 145 mEq/L   Potassium 4.2 3.5 - 5.1 mEq/L   Chloride 105 96 - 112 mEq/L   CO2 27 19 - 32 mEq/L   Glucose, Bld 82 70 - 99 mg/dL   BUN 13 6 - 23 mg/dL   Creatinine, Ser 2.13 0.40 - 1.20 mg/dL   GFR 08.65 >78.46 mL/min   Calcium 9.3 8.4 - 10.5 mg/dL      The 96-EXBM ASCVD risk score (Arnett DK, et al., 2019) is: 7%    Assessment & Plan:   Stress due to family tension  Essential hypertension -     Losartan Potassium; Take 1 tablet (25 mg total) by mouth daily.  Dispense: 90 tablet; Refill: 3 -     Basic  metabolic panel  Prediabetes -     Losartan Potassium; Take 1 tablet (25 mg total) by mouth daily.  Dispense: 90 tablet; Refill: 3 -     Hemoglobin A1c -     Basic metabolic panel -     metFORMIN HCl ER; Take 1 tablet (500 mg total) by mouth at bedtime.  Dispense: 90 tablet; Refill: 1    Return in about 3 months (around 07/22/2023).  A1c should be lower with her weight loss.  Level will pend recommendation to restart metformin.  Advised sustaining physical activity of 30 minutes or more on most days of the week.  Advised to minimize sodium.  Information was given on managing hypertension.  Believe the stress is a large component of her hypertension.  Mliss Sax, MD  12/5 addendum: A1c fell to 5.9 with weight loss.  Am still recommending Glucophage.

## 2023-04-23 NOTE — Addendum Note (Signed)
Addended by: Andrez Grime on: 04/23/2023 02:53 PM   Modules accepted: Orders

## 2023-05-08 LAB — HM MAMMOGRAPHY

## 2023-05-15 ENCOUNTER — Encounter: Payer: Self-pay | Admitting: Family Medicine

## 2023-10-29 ENCOUNTER — Other Ambulatory Visit: Payer: Self-pay

## 2023-10-29 ENCOUNTER — Emergency Department (HOSPITAL_BASED_OUTPATIENT_CLINIC_OR_DEPARTMENT_OTHER)

## 2023-10-29 ENCOUNTER — Emergency Department (HOSPITAL_BASED_OUTPATIENT_CLINIC_OR_DEPARTMENT_OTHER)
Admission: EM | Admit: 2023-10-29 | Discharge: 2023-10-29 | Disposition: A | Discharge status: Home / Self Care | Attending: Emergency Medicine | Admitting: Emergency Medicine

## 2023-10-29 ENCOUNTER — Encounter (HOSPITAL_BASED_OUTPATIENT_CLINIC_OR_DEPARTMENT_OTHER): Payer: Self-pay

## 2023-10-29 DIAGNOSIS — R0789 Other chest pain: Secondary | ICD-10-CM | POA: Insufficient documentation

## 2023-10-29 DIAGNOSIS — R110 Nausea: Secondary | ICD-10-CM | POA: Diagnosis not present

## 2023-10-29 DIAGNOSIS — I10 Essential (primary) hypertension: Secondary | ICD-10-CM | POA: Insufficient documentation

## 2023-10-29 DIAGNOSIS — Z87891 Personal history of nicotine dependence: Secondary | ICD-10-CM | POA: Insufficient documentation

## 2023-10-29 DIAGNOSIS — R072 Precordial pain: Secondary | ICD-10-CM | POA: Diagnosis present

## 2023-10-29 DIAGNOSIS — Z79899 Other long term (current) drug therapy: Secondary | ICD-10-CM | POA: Diagnosis not present

## 2023-10-29 DIAGNOSIS — R0602 Shortness of breath: Secondary | ICD-10-CM | POA: Diagnosis not present

## 2023-10-29 DIAGNOSIS — Z7984 Long term (current) use of oral hypoglycemic drugs: Secondary | ICD-10-CM | POA: Insufficient documentation

## 2023-10-29 LAB — CBC
HCT: 33.2 % — ABNORMAL LOW (ref 36.0–46.0)
Hemoglobin: 10.9 g/dL — ABNORMAL LOW (ref 12.0–15.0)
MCH: 27.7 pg (ref 26.0–34.0)
MCHC: 32.8 g/dL (ref 30.0–36.0)
MCV: 84.3 fL (ref 80.0–100.0)
Platelets: 221 10*3/uL (ref 150–400)
RBC: 3.94 MIL/uL (ref 3.87–5.11)
RDW: 14.2 % (ref 11.5–15.5)
WBC: 8.2 10*3/uL (ref 4.0–10.5)
nRBC: 0 % (ref 0.0–0.2)

## 2023-10-29 LAB — BASIC METABOLIC PANEL WITH GFR
Anion gap: 11 (ref 5–15)
BUN: 17 mg/dL (ref 6–20)
CO2: 24 mmol/L (ref 22–32)
Calcium: 9 mg/dL (ref 8.9–10.3)
Chloride: 108 mmol/L (ref 98–111)
Creatinine, Ser: 0.72 mg/dL (ref 0.44–1.00)
GFR, Estimated: >60 mL/min (ref >60)
Glucose, Bld: 79 mg/dL (ref 70–99)
Potassium: 4 mmol/L (ref 3.5–5.1)
Sodium: 143 mmol/L (ref 135–145)

## 2023-10-29 LAB — TROPONIN T, HIGH SENSITIVITY
Troponin T High Sensitivity: <15 ng/L (ref <19)
Troponin T High Sensitivity: <15 ng/L (ref <19)

## 2023-10-29 LAB — PREGNANCY, URINE: Preg Test, Ur: NEGATIVE

## 2023-10-29 NOTE — ED Provider Notes (Addendum)
 [facility] Provider Note   CSN: [id number] Arrival date & time: 10/29/23  1943     Patient presents with: Chest Pain   Kimberly [name] is a 58 y.o. female.   Patient with acute onset of substernal chest pain intermittent in nature lasting about 10 minutes.  Occurred just about an hour prior to arrival.  Associated with shortness of breath and nausea.  No fevers no cough.  No leg swelling.  Past medical history significant for hypertension.  Patient is former smoker quit in 2000.       Prior to Admission medications   Medication Sig Start Date End Date Taking? Authorizing Provider  cyclobenzaprine (FLEXERIL) 10 MG tablet PLEASE SEE ATTACHED FOR DETAILED DIRECTIONS    [provider]  ferrous sulfate (FER-IN-SOL) 75 (15 Fe) MG/ML SOLN Take by mouth. Taking iron gummies    [provider]  losartan  (COZAAR ) 25 MG tablet Take 1 tablet (25 mg total) by mouth daily. 04/23/23   Tonna Frederic, MD  metFORMIN  (GLUCOPHAGE -XR) 500 MG 24 hr tablet Take 1 tablet (500 mg total) by mouth at bedtime. 04/23/23   Tonna Frederic, MD    Allergies: Patient has no known allergies.    Review of Systems  Constitutional:  Negative for chills and fever.  HENT:  Negative for ear pain and sore throat.   Eyes:  Negative for pain and visual disturbance.  Respiratory:  Positive for shortness of breath. Negative for cough.   Cardiovascular:  Positive for chest pain. Negative for palpitations.  Gastrointestinal:  Positive for nausea. Negative for abdominal pain and vomiting.  Genitourinary:  Negative for dysuria and hematuria.  Musculoskeletal:  Negative for arthralgias and back pain.  Skin:  Negative for color change and rash.  Neurological:  Negative for seizures and syncope.  All other systems reviewed and are negative.   Updated Vital Signs BP 135/87   Pulse 81   Temp 98.4 F (36.9 C)   Resp 18   Wt 54 kg   SpO2 100%    BMI 21.77 kg/m   Physical Exam Vitals and nursing note reviewed.  Constitutional:      General: She is not in acute distress.    Appearance: Normal appearance. She is well-developed.  HENT:     Head: Normocephalic and atraumatic.   Eyes:     Extraocular Movements: Extraocular movements intact.     Conjunctiva/sclera: Conjunctivae normal.     Pupils: Pupils are equal, round, and reactive to light.    Cardiovascular:     Rate and Rhythm: Normal rate and regular rhythm.     Heart sounds: No murmur heard. Pulmonary:     Effort: Pulmonary effort is normal. No respiratory distress.     Breath sounds: Normal breath sounds. No stridor. No wheezing, rhonchi or rales.  Chest:     Chest wall: No tenderness.  Abdominal:     Palpations: Abdomen is soft.     Tenderness: There is no abdominal tenderness.   Musculoskeletal:        General: No swelling.     Cervical back: Normal range of motion and neck supple.     Right lower leg: No edema.     Left lower leg: No edema.   Skin:    General: Skin is warm and dry.     Capillary Refill: Capillary refill takes less than 2 seconds.   Neurological:     General: No focal deficit present.  Mental Status: She is alert and oriented to person, place, and time.   Psychiatric:        Mood and Affect: Mood normal.     (all labs ordered are listed, but only abnormal results are displayed) Labs Reviewed  CBC - Abnormal; Notable for the following components:      Result Value   Hemoglobin 10.9 (*)    HCT 33.2 (*)    All other components within normal limits  BASIC METABOLIC PANEL WITH GFR  PREGNANCY, URINE  TROPONIN T, HIGH SENSITIVITY    EKG: None  Radiology: DG Chest 2 View Result Date: 10/29/2023 CLINICAL DATA:  Chest pain, shortness of breath EXAM: CHEST - 2 VIEW COMPARISON:  05/21/2020 FINDINGS: The heart size and mediastinal contours are within normal limits. Both lungs are clear. The visualized skeletal structures are  unremarkable. IMPRESSION: No active cardiopulmonary disease. Electronically Signed   By: Janeece Mechanic M.D.   On: 10/29/2023 20:18     Procedures   Medications Ordered in the ED - No data to display                                  Medical Decision Making Amount and/or Complexity of Data Reviewed Labs: ordered. Radiology: ordered.   Patient is oxygen level here is 100% on room air.  Heart rate 87.  EKG without any acute findings.  Patient's basic metabolic panel normal CBC normal.  Initial troponin pending.  Pregnancy test negative and two-view chest x-ray without any acute findings.  Patient will need delta troponins.  Troponins are both negative x 2 less than 15.  Basic metabolic panel normal.  Renal function normal.  CBC white count 8.2 hemoglobin is 10.9 platelets are 221 pregnancy test negative chest x-ray without any acute findings.  Patient stable for discharge home will have her follow-up with cardiology.  Final diagnoses:  Atypical chest pain    ED Discharge Orders     None          Nicklas Barns, MD 10/29/23 1610    Nicklas Barns, MD 10/29/23 2250

## 2023-10-29 NOTE — ED Triage Notes (Signed)
 Pt arrives with c/o CP that started today. Pt endorses SOB and nausea.

## 2023-10-29 NOTE — Discharge Instructions (Signed)
 Workup for the chest pain without any acute findings.  Make an appointment to follow-up with radiologist.  In the meantime would recommend taking a baby aspirin a day.  Return for any new or worse symptoms.

## 2024-01-04 DIAGNOSIS — I10 Essential (primary) hypertension: Secondary | ICD-10-CM | POA: Insufficient documentation

## 2024-01-05 ENCOUNTER — Ambulatory Visit

## 2024-01-29 ENCOUNTER — Ambulatory Visit (INDEPENDENT_AMBULATORY_CARE_PROVIDER_SITE_OTHER): Admitting: Family Medicine

## 2024-01-29 ENCOUNTER — Encounter: Payer: Self-pay | Admitting: Family Medicine

## 2024-01-29 VITALS — BP 142/84 | HR 86 | Temp 97.2°F | Ht 62.0 in | Wt 112.6 lb

## 2024-01-29 DIAGNOSIS — R7303 Prediabetes: Secondary | ICD-10-CM

## 2024-01-29 DIAGNOSIS — Z13220 Encounter for screening for lipoid disorders: Secondary | ICD-10-CM | POA: Diagnosis not present

## 2024-01-29 DIAGNOSIS — I10 Essential (primary) hypertension: Secondary | ICD-10-CM

## 2024-01-29 DIAGNOSIS — Z114 Encounter for screening for human immunodeficiency virus [HIV]: Secondary | ICD-10-CM

## 2024-01-29 DIAGNOSIS — Z91199 Patient's noncompliance with other medical treatment and regimen due to unspecified reason: Secondary | ICD-10-CM

## 2024-01-29 DIAGNOSIS — D509 Iron deficiency anemia, unspecified: Secondary | ICD-10-CM

## 2024-01-29 DIAGNOSIS — Z1159 Encounter for screening for other viral diseases: Secondary | ICD-10-CM

## 2024-01-29 LAB — CBC WITH DIFFERENTIAL/PLATELET
Basophils Absolute: 0 K/uL (ref 0.0–0.1)
Basophils Relative: 0.6 % (ref 0.0–3.0)
Eosinophils Absolute: 0.1 K/uL (ref 0.0–0.7)
Eosinophils Relative: 1.2 % (ref 0.0–5.0)
HCT: 35.3 % — ABNORMAL LOW (ref 36.0–46.0)
Hemoglobin: 11.4 g/dL — ABNORMAL LOW (ref 12.0–15.0)
Lymphocytes Relative: 41.5 % (ref 12.0–46.0)
Lymphs Abs: 1.8 K/uL (ref 0.7–4.0)
MCHC: 32.3 g/dL (ref 30.0–36.0)
MCV: 84.6 fl (ref 78.0–100.0)
Monocytes Absolute: 0.2 K/uL (ref 0.1–1.0)
Monocytes Relative: 5.1 % (ref 3.0–12.0)
Neutro Abs: 2.2 K/uL (ref 1.4–7.7)
Neutrophils Relative %: 51.6 % (ref 43.0–77.0)
Platelets: 224 K/uL (ref 150.0–400.0)
RBC: 4.17 Mil/uL (ref 3.87–5.11)
RDW: 13.8 % (ref 11.5–15.5)
WBC: 4.3 K/uL (ref 4.0–10.5)

## 2024-01-29 LAB — URINALYSIS, ROUTINE W REFLEX MICROSCOPIC
Bilirubin Urine: NEGATIVE
Hgb urine dipstick: NEGATIVE
Ketones, ur: NEGATIVE
Leukocytes,Ua: NEGATIVE
Nitrite: NEGATIVE
Specific Gravity, Urine: 1.025 (ref 1.000–1.030)
Total Protein, Urine: NEGATIVE
Urine Glucose: NEGATIVE
Urobilinogen, UA: 0.2 (ref 0.0–1.0)
pH: 6 (ref 5.0–8.0)

## 2024-01-29 LAB — COMPREHENSIVE METABOLIC PANEL WITH GFR
ALT: 13 U/L (ref 0–35)
AST: 20 U/L (ref 0–37)
Albumin: 4.4 g/dL (ref 3.5–5.2)
Alkaline Phosphatase: 58 U/L (ref 39–117)
BUN: 10 mg/dL (ref 6–23)
CO2: 30 meq/L (ref 19–32)
Calcium: 9.5 mg/dL (ref 8.4–10.5)
Chloride: 104 meq/L (ref 96–112)
Creatinine, Ser: 0.83 mg/dL (ref 0.40–1.20)
GFR: 77.99 mL/min (ref >60.00)
Glucose, Bld: 84 mg/dL (ref 70–99)
Potassium: 3.9 meq/L (ref 3.5–5.1)
Sodium: 140 meq/L (ref 135–145)
Total Bilirubin: 0.6 mg/dL (ref 0.2–1.2)
Total Protein: 7.1 g/dL (ref 6.0–8.3)

## 2024-01-29 LAB — LIPID PANEL
Cholesterol: 205 mg/dL — ABNORMAL HIGH (ref 0–200)
HDL: 73.9 mg/dL (ref >39.00)
LDL Cholesterol: 116 mg/dL — ABNORMAL HIGH (ref 0–99)
NonHDL: 130.72
Total CHOL/HDL Ratio: 3
Triglycerides: 73 mg/dL (ref 0.0–149.0)
VLDL: 14.6 mg/dL (ref 0.0–40.0)

## 2024-01-29 LAB — HEMOGLOBIN A1C: Hgb A1c MFr Bld: 6.1 % (ref 4.6–6.5)

## 2024-01-29 MED ORDER — LOSARTAN POTASSIUM 25 MG PO TABS: 25.0000 mg | ORAL_TABLET | Freq: Every day | ORAL | 1 refills | Status: AC

## 2024-01-29 NOTE — Progress Notes (Signed)
 Established Patient Office Visit   Subjective:  Patient ID: Kimberly [name], female    DOB: August 01, 1965  Age: 58 y.o. MRN: [id number]  Chief Complaint  Patient presents with   Medical Management of Chronic Issues    Over due follow up. Pt is fasting. Pt states she stopped taking her Metformin  1 month ago. States medication makes her lose her appetite.     HPI Encounter Diagnoses  Name Primary?   Essential hypertension Yes   Prediabetes    Screening for cholesterol level    Iron deficiency anemia, unspecified iron deficiency anemia type    Encounter for hepatitis C screening test for low risk patient    Screening for HIV (human immunodeficiency virus)    Medically noncompliant    For follow-up of above.  Last seen in December of last year and asked to return in March.  Self discontinued metformin  secondary to anorexia.  Losartan  last taken 1 hour before arriving at the clinic.  She has been mindful of the carbohydrates in her diet and lost 7 additional pounds.  She is currently dealing with trismus and is a candidate for TMJ surgery.  Her oral surgeon is needing an updated A1c.   Review of Systems  Constitutional: Negative.   HENT: Negative.    Eyes:  Negative for blurred vision, discharge and redness.  Respiratory: Negative.    Cardiovascular: Negative.   Gastrointestinal:  Negative for abdominal pain.  Genitourinary: Negative.   Musculoskeletal: Negative.  Negative for myalgias.  Skin:  Negative for rash.  Neurological:  Negative for tingling, loss of consciousness and weakness.  Endo/Heme/Allergies:  Negative for polydipsia.     Current Outpatient Medications:    cyclobenzaprine (FLEXERIL) 10 MG tablet, PLEASE SEE ATTACHED FOR DETAILED DIRECTIONS, Disp: , Rfl:    ferrous sulfate (FER-IN-SOL) 75 (15 Fe) MG/ML SOLN, Take by mouth. Taking iron gummies, Disp: , Rfl:    losartan  (COZAAR ) 25 MG tablet, Take 1 tablet (25 mg total) by mouth daily., Disp: 90 tablet, Rfl: 1    Objective:     BP (!) 142/84 (BP Location: Left Arm, Patient Position: Sitting, Cuff Size: Small)   Pulse 86   Temp (!) 97.2 F (36.2 C) (Temporal)   Ht 5' 2 (1.575 m)   Wt 112 lb 9.6 oz (51.1 kg)   SpO2 99%   BMI 20.59 kg/m  BP Readings from Last 3 Encounters:  01/29/24 (!) 142/84  10/29/23 (!) 152/89  04/23/23 (!) 144/84   Wt Readings from Last 3 Encounters:  01/29/24 112 lb 9.6 oz (51.1 kg)  10/29/23 119 lb (54 kg)  04/23/23 119 lb 9.6 oz (54.3 kg)      Physical Exam Constitutional:      General: She is not in acute distress.    Appearance: Normal appearance. She is not ill-appearing, toxic-appearing or diaphoretic.  HENT:     Head: Normocephalic and atraumatic.     Right Ear: External ear normal.     Left Ear: External ear normal.     Mouth/Throat:     Mouth: Mucous membranes are moist.     Pharynx: Oropharynx is clear. No oropharyngeal exudate or posterior oropharyngeal erythema.     Comments: Trismus noted Eyes:     General: No scleral icterus.       Right eye: No discharge.        Left eye: No discharge.     Extraocular Movements: Extraocular movements intact.     Conjunctiva/sclera: Conjunctivae normal.  Pupils: Pupils are equal, round, and reactive to light.  Cardiovascular:     Rate and Rhythm: Normal rate and regular rhythm.  Pulmonary:     Effort: Pulmonary effort is normal. No respiratory distress.     Breath sounds: Normal breath sounds. No wheezing or rales.  Musculoskeletal:     Cervical back: No rigidity or tenderness.  Skin:    General: Skin is warm and dry.  Neurological:     Mental Status: She is alert and oriented to person, place, and time.  Psychiatric:        Mood and Affect: Mood normal.        Behavior: Behavior normal.      No results found for any visits on 01/29/24.    The 10-year ASCVD risk score (Arnett DK, et al., 2019) is: 6.7%    Assessment & Plan:   Essential hypertension -     CBC with  Differential/Platelet -     Comprehensive metabolic panel with GFR -     Urinalysis, Routine w reflex microscopic -     Losartan  Potassium; Take 1 tablet (25 mg total) by mouth daily.  Dispense: 90 tablet; Refill: 1  Prediabetes -     Comprehensive metabolic panel with GFR -     Hemoglobin A1c -     Losartan  Potassium; Take 1 tablet (25 mg total) by mouth daily.  Dispense: 90 tablet; Refill: 1  Screening for cholesterol level -     Comprehensive metabolic panel with GFR -     Lipid panel  Iron deficiency anemia, unspecified iron deficiency anemia type -     CBC with Differential/Platelet -     Iron, TIBC and Ferritin Panel  Encounter for hepatitis C screening test for low risk patient -     Hepatitis C antibody  Screening for HIV (human immunodeficiency virus) -     HIV Antibody (routine testing w rflx)  Medically noncompliant    Return in about 3 months (around 04/29/2024), or Please check and record your blood pressures..  May not need further medication for prediabetes with her current weight loss.  No need to lose further weight.  Emphasized the importance of timely follow-up.  Elsie Sim Lent, MD

## 2024-01-30 LAB — HEPATITIS C ANTIBODY: Hepatitis C Ab: NONREACTIVE

## 2024-01-30 LAB — IRON,TIBC AND FERRITIN PANEL
%SAT: 33 % (ref 16–45)
Ferritin: 70 ng/mL (ref 16–232)
Iron: 104 ug/dL (ref 45–160)
TIBC: 315 ug/dL (ref 250–450)

## 2024-01-30 LAB — HIV ANTIBODY (ROUTINE TESTING W REFLEX)
HIV 1&2 Ab, 4th Generation: NONREACTIVE
HIV FINAL INTERPRETATION: NEGATIVE

## 2024-02-01 ENCOUNTER — Ambulatory Visit: Payer: Self-pay | Admitting: Family Medicine

## 2024-04-18 NOTE — H&P (Signed)
  Kimberly [name], Kimberly [name] is a 58 yo who was referred by  DDS for TMJ evaluation.   CC: Right and left TMJ pain, can't open mouth wide.   HPI: Multiple year h/o limited opening, clicking, popping, and  TMJ pain. H/o Clenching and grinding. Denies injury, locking.  Past Medical History: High Blood Pressure, Sinus Issues, Snoring, Bruise Easily, Pre-Diabetes  Medications: Losartan , Metformin    Allergies: NKDA  Exam: BMI 20. 1. TMJ: No clicking or popping of the TMJ. The maximum opening was 20 mm, with normal range of motion. The occlusion was Class I. The muscles of mastication were not tender to palpation. There was no crepitus present. Mallampati 1. Oral cancer screening negative. Pharynx clear. No lymphadenopathy 2. Deep dental caries tooth # 31 distal.  Pan: Flattened  right condylar head with small anterior spur. Left TMJ joint anatomy not clearly defined. Deep dental caries tooth # 31 distal.  Assessment: ASA  2.  TMJ arthralgia with partial ankylosis. Non-restorable tooth # 31.   Plan: Recommend Bilateral TMJ arthrocentesis with manipulation under anesthesia. Extraction tooth # 31.        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

## 2024-04-22 ENCOUNTER — Encounter (HOSPITAL_COMMUNITY): Admission: RE | Payer: Self-pay | Source: Home / Self Care

## 2024-04-22 ENCOUNTER — Ambulatory Visit (HOSPITAL_COMMUNITY): Admission: RE | Admit: 2024-04-22 | Source: Home / Self Care | Admitting: Oral Surgery

## 2024-04-22 SURGERY — DENTAL RESTORATION/EXTRACTIONS
Anesthesia: General

## 2024-04-28 ENCOUNTER — Ambulatory Visit: Admitting: Family Medicine

## 2024-06-15 ENCOUNTER — Telehealth: Payer: Self-pay

## 2024-06-15 NOTE — Telephone Encounter (Signed)
 Called patient and asked if she is will to come in tomorrow at 8:20 AM with Tinnie Harada, NP or Rosina Senters, FNP at 10:20 AM. Patient stated that those times would not work for her. I asked if she was in a rush to have the pap smear performed and she stated no that it had been a couple of years since she last had one. I informed her to keep her scheduled 3 month appointment with Dr. Berneta for tomorrow at 3:20 PM and at the end before she leaves she can schedule a Transfer of Care appointment with a female provider to have her pap smear. She then stated that she does not want to change doctors that she knows Dr. Berneta does not perform pap smears. I informed patient that Dr. Berneta will be retiring end of summer beginning of fall. She thanked me for this information and stated that she will discuss with Dr. Berneta who he recommends.

## 2024-06-15 NOTE — Telephone Encounter (Signed)
 Copied from CRM #[id number]. Topic: Clinical - Request for Lab/Test Order >> Jun 15, 2024 11:35 AM Ahlexyia S wrote: Reason for CRM: Pt is requesting to have a pap smear done with a female provider. Pt would like to be contacted to confirm if she can be scheduled to receive or not.

## 2024-06-16 ENCOUNTER — Ambulatory Visit: Admitting: Family Medicine

## 2024-06-16 ENCOUNTER — Ambulatory Visit (INDEPENDENT_AMBULATORY_CARE_PROVIDER_SITE_OTHER): Admitting: Family Medicine

## 2024-06-16 ENCOUNTER — Encounter: Payer: Self-pay | Admitting: Family Medicine

## 2024-06-16 VITALS — BP 124/80 | HR 83 | Temp 97.9°F | Ht 62.0 in | Wt 112.2 lb

## 2024-06-16 DIAGNOSIS — D509 Iron deficiency anemia, unspecified: Secondary | ICD-10-CM

## 2024-06-16 DIAGNOSIS — R7303 Prediabetes: Secondary | ICD-10-CM | POA: Diagnosis not present

## 2024-06-16 DIAGNOSIS — I10 Essential (primary) hypertension: Secondary | ICD-10-CM

## 2024-06-16 NOTE — Progress Notes (Signed)
 "  Established Patient Office Visit   Subjective:  Patient ID: Kimberly [name], female    DOB: 1965/12/09  Age: 59 y.o. MRN: [id number]  Chief Complaint  Patient presents with   Medical Management of Chronic Issues    Three month follow-up     HPI Encounter Diagnoses  Name Primary?   Essential hypertension Yes   Prediabetes    Iron deficiency anemia, unspecified iron deficiency anemia type    Follow-up of above.  Continues losartan  25 mg daily for well-controlled hypertension.  Prediabetes controlled with active lifestyle and healthy diet.  Continues iron Gummies for iron deficiency.  Postmenopausal with no vaginal bleeding.  No melena or hematochezia.  No hematuria.  Next colonoscopy is due 2027.   Review of Systems  Constitutional: Negative.  Negative for chills, diaphoresis, malaise/fatigue and weight loss.  HENT: Negative.    Eyes: Negative.  Negative for blurred vision, double vision, discharge and redness.  Respiratory: Negative.    Cardiovascular: Negative.  Negative for chest pain.  Gastrointestinal:  Negative for abdominal pain.  Genitourinary: Negative.   Musculoskeletal: Negative.  Negative for falls and myalgias.  Skin:  Negative for rash.  Neurological:  Negative for tingling, speech change, loss of consciousness and weakness.  Endo/Heme/Allergies:  Negative for polydipsia.  Psychiatric/Behavioral: Negative.        06/16/2024    3:53 PM 04/23/2023   10:45 AM 10/22/2022   10:26 AM  Depression screen PHQ 2/9  Decreased Interest 0 0 0  Down, Depressed, Hopeless 0 2 0  PHQ - 2 Score 0 2 0  Altered sleeping 0 1   Tired, decreased energy 0 0   Change in appetite 0 0   Feeling bad or failure about yourself  0 0   Trouble concentrating 0 0   Moving slowly or fidgety/restless 2 0   Suicidal thoughts 0 0   PHQ-9 Score 2 3    Difficult doing work/chores Not difficult at all Not difficult at all      Data saved with a previous flowsheet row definition     Current  Medications[1]   Objective:     BP 124/80   Pulse 83   Temp 97.9 F (36.6 C)   Ht 5' 2 (1.575 m)   Wt 112 lb 3.2 oz (50.9 kg)   SpO2 98%   BMI 20.52 kg/m  BP Readings from Last 3 Encounters:  06/16/24 124/80  01/29/24 (!) 142/84  10/29/23 (!) 152/89   Wt Readings from Last 3 Encounters:  06/16/24 112 lb 3.2 oz (50.9 kg)  01/29/24 112 lb 9.6 oz (51.1 kg)  10/29/23 119 lb (54 kg)      Physical Exam Constitutional:      General: She is not in acute distress.    Appearance: Normal appearance. She is not ill-appearing, toxic-appearing or diaphoretic.  HENT:     Head: Normocephalic and atraumatic.     Right Ear: External ear normal.     Left Ear: External ear normal.  Eyes:     General: No scleral icterus.       Right eye: No discharge.        Left eye: No discharge.     Extraocular Movements: Extraocular movements intact.     Conjunctiva/sclera: Conjunctivae normal.  Pulmonary:     Effort: Pulmonary effort is normal. No respiratory distress.  Skin:    General: Skin is warm and dry.  Neurological:     Mental Status: She is alert and oriented  to person, place, and time.  Psychiatric:        Mood and Affect: Mood normal.        Behavior: Behavior normal.      No results found for any visits on 06/16/24.    The 10-year ASCVD risk score (Arnett DK, et al., 2019) is: 4.5%    Assessment & Plan:   Essential hypertension -     Basic metabolic panel with GFR  Prediabetes -     Basic metabolic panel with GFR -     Hemoglobin A1c  Iron deficiency anemia, unspecified iron deficiency anemia type -     Iron, TIBC and Ferritin Panel    Return in about 6 months (around 12/14/2024).  Continue losartan  for hypertension.  Continue healthy active lifestyle.  Elsie Sim Lent, MD    [1]  Current Outpatient Medications:    ferrous sulfate (FER-IN-SOL) 75 (15 Fe) MG/ML SOLN, Take by mouth. Taking iron gummies, Disp: , Rfl:    losartan  (COZAAR ) 25 MG tablet,  Take 1 tablet (25 mg total) by mouth daily., Disp: 90 tablet, Rfl: 1   cyclobenzaprine (FLEXERIL) 10 MG tablet, PLEASE SEE ATTACHED FOR DETAILED DIRECTIONS (Patient not taking: Reported on 06/16/2024), Disp: , Rfl:   "

## 2024-06-17 ENCOUNTER — Ambulatory Visit: Payer: Self-pay | Admitting: Family Medicine

## 2024-06-17 LAB — BASIC METABOLIC PANEL WITH GFR
BUN: 16 mg/dL (ref 6–23)
CO2: 25 meq/L (ref 19–32)
Calcium: 9.3 mg/dL (ref 8.4–10.5)
Chloride: 106 meq/L (ref 96–112)
Creatinine, Ser: 0.84 mg/dL (ref 0.40–1.20)
GFR: 76.67 mL/min
Glucose, Bld: 76 mg/dL (ref 70–99)
Potassium: 4.1 meq/L (ref 3.5–5.1)
Sodium: 141 meq/L (ref 135–145)

## 2024-06-17 LAB — IRON,TIBC AND FERRITIN PANEL
%SAT: 26 % (ref 16–45)
Ferritin: 59 ng/mL (ref 16–232)
Iron: 83 ug/dL (ref 45–160)
TIBC: 323 ug/dL (ref 250–450)

## 2024-06-17 LAB — HEMOGLOBIN A1C: Hgb A1c MFr Bld: 5.9 % (ref 4.6–6.5)

## 2024-12-16 ENCOUNTER — Ambulatory Visit: Admitting: Family Medicine
